# Patient Record
Sex: Male | Born: 1960 | Race: White | Hispanic: No | Marital: Married | Smoking: Former smoker
Health system: Southern US, Community
[De-identification: ages and names within clinical notes are randomized; demographics above are authoritative.]

## PROBLEM LIST (undated history)

## (undated) DIAGNOSIS — I219 Acute myocardial infarction, unspecified: Secondary | ICD-10-CM

## (undated) DIAGNOSIS — I2510 Atherosclerotic heart disease of native coronary artery without angina pectoris: Secondary | ICD-10-CM

## (undated) DIAGNOSIS — F419 Anxiety disorder, unspecified: Secondary | ICD-10-CM

## (undated) DIAGNOSIS — K219 Gastro-esophageal reflux disease without esophagitis: Secondary | ICD-10-CM

## (undated) HISTORY — DX: Gastro-esophageal reflux disease without esophagitis: K21.9

## (undated) HISTORY — PX: CARDIAC CATHETERIZATION: SHX172

## (undated) HISTORY — DX: Acute myocardial infarction, unspecified: I21.9

## (undated) HISTORY — DX: Anxiety disorder, unspecified: F41.9

## (undated) HISTORY — DX: Atherosclerotic heart disease of native coronary artery without angina pectoris: I25.10

---

## 1998-09-17 DIAGNOSIS — I219 Acute myocardial infarction, unspecified: Secondary | ICD-10-CM

## 1998-09-17 HISTORY — PX: CORONARY STENT PLACEMENT: SHX1402

## 1998-09-17 HISTORY — DX: Acute myocardial infarction, unspecified: I21.9

## 1999-05-19 ENCOUNTER — Inpatient Hospital Stay (HOSPITAL_COMMUNITY): Admission: EM | Admit: 1999-05-19 | Discharge: 1999-05-23 | Payer: Self-pay | Admitting: Cardiology

## 1999-12-15 ENCOUNTER — Emergency Department (HOSPITAL_COMMUNITY): Admission: EM | Admit: 1999-12-15 | Discharge: 1999-12-15 | Payer: Self-pay | Admitting: Emergency Medicine

## 2009-12-08 ENCOUNTER — Ambulatory Visit: Payer: Self-pay | Admitting: Cardiovascular Disease

## 2011-01-30 NOTE — Assessment & Plan Note (Signed)
[facility]                        [facility] OFFICE NOTE   NAME:[name], [name]                         MRN:          [id number]  DATE:12/08/2009                            DOB:          1961-07-22    CHIEF COMPLAINT:  Changing cardiovascular care.   HISTORY OF PRESENT ILLNESS:  [name] [name] is a 50 year old white male with  past medical history significant for coronary artery disease status post  stent to the right coronary artery in 2000, hyperlipidemia,  hypertension, who is presenting to change cardiovascular care.  The  patient states that since his myocardial infarction and stent in 2000,  he is getting along reasonably well from a cardiovascular standpoint.  He has had several stress test that have been within normal limits, and  he denies any chest discomfort, shortness of breath, lower extremity  edema, PND, orthopnea, or syncope.  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  However, he does not have any regimented exercise routine.   PAST MEDICAL HISTORY:  As above in HPI.   SOCIAL HISTORY:  The patient smoked heavily until his myocardial  infarction in 2000, he has since quit.  He does drink approximately 8  hard liquor beverages a day.   FAMILY HISTORY:  Negative for premature coronary disease.   ALLERGIES:  No known drug allergies.   MEDICATIONS:  1. Aspirin 81 mg daily.  2. Crestor 40 mg daily.  3. Lisinopril 20 mg daily.  4. Fenofibrate 134 mg daily.  5. Omeprazole 40 mg b.i.d.   REVIEW OF SYSTEMS:  As in HPI, otherwise negative.   PHYSICAL EXAMINATION:  VITAL SIGNS:  Blood pressure in the right arm  149/97, left arm 152/101, pulse is 70, saturating 98% on room air,  weighs 215 pounds.  GENERAL:  No acute distress.  HEENT:  Normocephalic, atraumatic.  NECK:  Supple.  There is no JVD.  There are no carotid bruits.  HEART:  Regular rate and rhythm without  murmur, rub, or gallop.  LUNGS:  Clear bilaterally.  ABDOMEN:  Soft, nontender, nondistended.  EXTREMITIES:  Without edema.  SKIN:  Warm and dry.  The patient signs of rosacea on his face.  MUSCULOSKELETAL:  5/5 bilateral upper lower extremity strength.  NEURO:  Nonfocal.   EKG taken today in clinic demonstrates normal sinus rhythm without any  significant ST or T-wave abnormalities.  Review of the patient's stress  test in 2005 shows inferior scar but no ischemia.  His ejection fraction  was thought to be 45% at that time.  This is labs from January 2010,  total cholesterol 140, HDL 23, LDL 83, triglycerides 170.  LFTs within  normal limits.   ASSESSMENT:  A 50 year old white male with known candidate of coronary  artery disease, who is not having any symptoms consistent with angina,  heart failure, or arrhythmia.  His alcohol intake is concerning.  His  last ejection fraction in 2005 on a stress test was also 45%; however,  he is not having any clear symptoms consistent with heart  failure.  The  patient's blood pressure is not well controlled, but he ran out of his  lisinopril 1 week ago, and his wife states that at home his blood  pressure is always within normal limits when he takes his medication.   PLAN:  We will refill the patient's prescriptions and urge him to be  compliant with his medications.  We will check a CMP, CBC, fasting  lipids, and vitamin D.  He is counseled at length regarding alcohol  intake and the dangers of being on Crestor, fenofibrate, and drinking a  significant amount of alcohol.  He was also counseled on the possible  effects of cardiomyopathy from the alcohol.  In addition to labs, we  will check a transthoracic echocardiogram in order to evaluate the  patient's left ventricular systolic function as his last imaging study  appeared to show a decrease in ejection fraction.     Brayton El, MD  Electronically Signed    SGA/MedQ  DD: 12/08/2009   DT: 12/09/2009  Job #: [id number]

## 2011-03-13 ENCOUNTER — Encounter: Payer: Self-pay | Admitting: Cardiovascular Disease

## 2011-05-15 ENCOUNTER — Encounter: Payer: Self-pay | Admitting: Cardiovascular Disease

## 2011-06-06 ENCOUNTER — Encounter: Payer: Self-pay | Admitting: Cardiovascular Disease

## 2011-12-12 ENCOUNTER — Other Ambulatory Visit: Payer: Self-pay | Admitting: Cardiology

## 2011-12-12 MED ORDER — LISINOPRIL 20 MG PO TABS: 20.0000 mg | ORAL_TABLET | Freq: Every day | ORAL | Status: DC

## 2012-01-09 ENCOUNTER — Encounter: Payer: Self-pay | Admitting: Cardiology

## 2012-01-21 ENCOUNTER — Encounter: Payer: Self-pay | Admitting: Cardiology

## 2013-02-06 ENCOUNTER — Other Ambulatory Visit: Payer: Self-pay | Admitting: Cardiology

## 2014-11-29 ENCOUNTER — Encounter: Payer: Self-pay | Admitting: *Deleted

## 2014-11-29 ENCOUNTER — Ambulatory Visit: Payer: Self-pay | Admitting: Cardiovascular Disease

## 2014-11-29 ENCOUNTER — Other Ambulatory Visit: Payer: Self-pay | Admitting: *Deleted

## 2014-12-20 ENCOUNTER — Encounter (INDEPENDENT_AMBULATORY_CARE_PROVIDER_SITE_OTHER): Payer: Self-pay

## 2014-12-20 ENCOUNTER — Encounter: Payer: Self-pay | Admitting: *Deleted

## 2014-12-20 ENCOUNTER — Encounter: Payer: Self-pay | Admitting: Cardiovascular Disease

## 2014-12-20 ENCOUNTER — Ambulatory Visit (INDEPENDENT_AMBULATORY_CARE_PROVIDER_SITE_OTHER): Payer: BLUE CROSS/BLUE SHIELD | Admitting: Cardiovascular Disease

## 2014-12-20 VITALS — BP 145/84 | HR 73 | Ht 68.0 in | Wt 226.0 lb

## 2014-12-20 DIAGNOSIS — E785 Hyperlipidemia, unspecified: Secondary | ICD-10-CM

## 2014-12-20 DIAGNOSIS — R079 Chest pain, unspecified: Secondary | ICD-10-CM | POA: Diagnosis not present

## 2014-12-20 DIAGNOSIS — I25118 Atherosclerotic heart disease of native coronary artery with other forms of angina pectoris: Secondary | ICD-10-CM

## 2014-12-20 DIAGNOSIS — I10 Essential (primary) hypertension: Secondary | ICD-10-CM

## 2014-12-20 DIAGNOSIS — R12 Heartburn: Secondary | ICD-10-CM | POA: Diagnosis not present

## 2014-12-20 DIAGNOSIS — E1169 Type 2 diabetes mellitus with other specified complication: Secondary | ICD-10-CM | POA: Insufficient documentation

## 2014-12-20 NOTE — Patient Instructions (Addendum)
Your physician wants you to follow-up in: 1 year with Dr. Kirke CorinArida. You will receive a reminder letter in the mail two months in advance. If you don't receive a letter, please call our office to schedule the follow-up appointment.   [facility] MYOVIEW  Your caregiver has ordered a Stress Test with nuclear imaging. The purpose of this test is to evaluate the blood supply to your heart muscle. This procedure is referred to as a "Non-Invasive Stress Test." This is because other than having an IV started in your vein, nothing is inserted or "invades" your body. Cardiac stress tests are done to find areas of poor blood flow to the heart by determining the extent of coronary artery disease (CAD). Some patients exercise on a treadmill, which naturally increases the blood flow to your heart, while others who are  unable to walk on a treadmill due to physical limitations have a pharmacologic/chemical stress agent called Lexiscan . This medicine will mimic walking on a treadmill by temporarily increasing your coronary blood flow.   Please note: these test may take anywhere between 2-4 hours to complete  [facility]  THE VOLUNTEERS AT THE FIRST DESK WILL DIRECT YOU WHERE TO GO  Date of Procedure:___________Wednesday 4/13/16__________________________  Arrival Time for Procedure:___________9:45 am___________________  Instructions regarding medication:   ____ : Hold diabetes medication morning of procedure  __x__:  Hold betablocker(s) night before procedure and morning of procedure-  (atenolol)  ____:  Hold other medications as follows:_________________________________________________________________________________________________________________________________________________________________________________________________________________________________________________________________________________________  PLEASE NOTIFY THE OFFICE AT LEAST 24 HOURS IN ADVANCE IF YOU ARE UNABLE  TO KEEP YOUR APPOINTMENT.  [phone] AND  PLEASE NOTIFY NUC[facility]AR MEDICINE AT [facility] AT LEAST 24 HOURS IN ADVANCE IF YOU ARE UNABLE TO KEEP YOUR APPOINTMENT. [phone]  How to prepare for your Myoview test:  1. Do not eat or drink after midnight 2. No caffeine for 24 hours prior to test 3. No smoking 24 hours prior to test. 4. Your medication may be taken with water.  If your doctor stopped a medication because of this test, do not take that medication. 5. Ladies, please do not wear dresses.  Skirts or pants are appropriate. Please wear a short sleeve shirt. 6. No perfume, cologne or lotion. 7. Wear comfortable walking shoes. No heels!

## 2014-12-20 NOTE — Assessment & Plan Note (Signed)
The patient has known history of coronary artery disease with previous myocardial infarction and stenting to the right coronary artery in 2000. Current symptoms include worsening heartburn which is likely due to GERD. However, he has chronic exertional dyspnea and no recent cardiac evaluation. Thus, I requested a treadmill nuclear stress test for evaluation. His baseline ECG is abnormal with evidence of prior inferior infarct. Continue aggressive treatment of risk factors.

## 2014-12-20 NOTE — Assessment & Plan Note (Signed)
He reports significant myalgia and intolerance to atorvastatin. He is tolerating rosuvastatin but it is not covered by his insurance. I asked him to discuss with Dr. Sherie DonLada as I don't have the results of his lipid profile. Basically I recommend a target LDL of less than 70. If episodes tend statin is needed, then a prior authorization for rosuvastatin might be needed. If his LDL was not significantly elevated, a trial of pravastatin can be considered.

## 2014-12-20 NOTE — Assessment & Plan Note (Signed)
Blood pressure improved gradually after resuming antihypertensive medications.

## 2014-12-20 NOTE — Progress Notes (Signed)
Primary care physician: Dr. Sherie Don  HPI  [name] is a 54 year old white male with past medical history significant for coronary artery disease status post myocardial infarction and stent placement to the right coronary artery in 2000, hyperlipidemia, and hypertension.  He was seen by our group in the [place] office with most recent visit in 2011. He had no cardiac events since his stent placement. She has other chronic medical conditions that include hypertension, hyperlipidemia and excessive alcohol use. He quit smoking after his myocardial infarction in 2000. He reports worsening heartburn recently with known history of GERD. He swallowed a crown which might have aggravated his symptoms especially that he tried to induce vomiting to retrieve it. He has stable exertional dyspnea with no chest tightness. He reports binge drinking of alcohol on occasions. He has no family history of coronary artery disease. 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 Blood pressure was running high recently but improved after resuming antihypertensive medications.  No Known Allergies   Current Outpatient Prescriptions on File Prior to Visit  Medication Sig Dispense Refill  . aspirin 81 MG tablet Take 81 mg by mouth daily.       No current facility-administered medications on file prior to visit.     Past Medical History  Diagnosis Date  . CAD (coronary artery disease)     s/p stenting  . HLD (hyperlipidemia)   . HTN (hypertension)   . GERD (gastroesophageal reflux disease)   . MI (myocardial infarction)      Past Surgical History  Procedure Laterality Date  . Coronary stent placement  9/00  . Cardiac catheterization      [facility]     Family History  Problem Relation Age of Onset  . Colitis    . Heart disease    . Hypertension Mother   . Hyperlipidemia Mother   . Hypertension Father   . Hyperlipidemia Father      History   Social History  . Marital Status: Married    Spouse Name: N/A    . Number of Children: 0  . Years of Education: N/A   Occupational History  . [employer]ICE [employer]    Social History Main Topics  . Smoking status: Former Smoker    Quit date: 05/19/1999  . Smokeless tobacco: Not on file  . Alcohol Use: 25.0 oz/week    50 Standard drinks or equivalent per week  . Drug Use: No     Comment: last in 1980's  . Sexual Activity: Not on file   Other Topics Concern  . Not on file   Social History Narrative     ROS A 10 point review of system was performed. It is negative other than that mentioned in the history of present illness.   PHYSICAL EXAM   BP 145/84 mmHg  Pulse 73  Ht  (1.727 m)  Wt 226 lb (102.513 kg)  BMI 34.37 kg/m2 Constitutional: He is oriented to person, place, and time. He appears well-developed and well-nourished. No distress.  HENT: No nasal discharge.  Head: Normocephalic and atraumatic.  Eyes: Pupils are equal and round.  No discharge. Neck: Normal range of motion. Neck supple. No JVD present. No thyromegaly present.  Cardiovascular: Normal rate, regular rhythm, normal heart sounds. Exam reveals no gallop and no friction rub. No murmur heard.  Pulmonary/Chest: Effort normal and breath sounds normal. No stridor. No respiratory distress. He has no wheezes. He has no rales. He exhibits no tenderness.  Abdominal: Soft. Bowel sounds  are normal. He exhibits no distension. There is no tenderness. There is no rebound and no guarding.  Musculoskeletal: Normal range of motion. He exhibits no edema and no tenderness.  Neurological: He is alert and oriented to person, place, and time. Coordination normal.  Skin: Skin is warm and dry. No rash noted. He is not diaphoretic. No erythema. No pallor.  Psychiatric: He has a normal mood and affect. His behavior is normal. Judgment and thought content normal.       VZD:GLOVFEKG:Sinus  Rhythm  -Inferior infarct -age undetermined.   ABNORMAL    ASSESSMENT AND PLAN

## 2014-12-27 ENCOUNTER — Telehealth: Payer: Self-pay | Admitting: *Deleted

## 2014-12-27 NOTE — Telephone Encounter (Signed)
Patient needs to r/s his Myoview.

## 2014-12-27 NOTE — Telephone Encounter (Signed)
Spoke w/ pt's wife.  Advised her that I was calling to resched pt's myoview. Inquired as to what day they would like to move it to.  She states that she is not near a calendar and for me to pick a day next month.  Asked her to call back w/ a date that works for them.  She is appreciative and will call back.

## 2015-02-22 ENCOUNTER — Telehealth: Payer: Self-pay | Admitting: Family Medicine

## 2015-02-22 MED ORDER — ATENOLOL 25 MG PO TABS: 12.5000 mg | ORAL_TABLET | Freq: Every day | ORAL | Status: DC

## 2015-02-22 MED ORDER — RANITIDINE HCL 300 MG PO TABS: 300.0000 mg | ORAL_TABLET | Freq: Every day | ORAL | Status: DC

## 2015-02-22 NOTE — Telephone Encounter (Signed)
Patients wife called stating that her husband needs refill on his Lisinopril and Atenolol sent to [facility]. Fax # [phone],thanks.

## 2015-02-22 NOTE — Telephone Encounter (Signed)
Spoke with patient wife and explain how he should be taking his medicine and let her know he needs and appt to be seen with fasting labs. She got upset and hung the phone.

## 2015-02-22 NOTE — Telephone Encounter (Signed)
Our list had him taking 12.5 mg of atenolol daily; if he taking it differently, please have him contact Dr. Kirke CorinARIDA since Dr. Kirke CorinArida is his heart doctor, he can adjust and prescribe the beta-blocker if it has been changed since I started it Patient did not return to see me in mid to late April, needs appt Please ask him to schedule appt and we'll get fasting labs then

## 2015-03-01 ENCOUNTER — Other Ambulatory Visit: Payer: Self-pay | Admitting: Family Medicine

## 2015-03-01 NOTE — Telephone Encounter (Signed)
Pt's wife called stated pt is out of Lisinopril and needs a refill ASAP. Pt is scheduled for an appt on 03/09/15 @ 1pm. Would like to know if enough medication can be called in to last until the date of his appt.

## 2015-03-01 NOTE — Telephone Encounter (Signed)
I really need the exact medicine, dose, instructions, etc. loaded into the med list along with allergies please Contact pharmacy if you don't have the details or there is any question This has to be exact as we transition from [facility] to [facility] Thank you

## 2015-03-02 MED ORDER — LISINOPRIL-HYDROCHLOROTHIAZIDE 20-12.5 MG PO TABS: 1.0000 | ORAL_TABLET | Freq: Every day | ORAL | Status: DC

## 2015-03-02 NOTE — Telephone Encounter (Signed)
Medication and dosage added. Routing to provider.

## 2015-03-04 DIAGNOSIS — K219 Gastro-esophageal reflux disease without esophagitis: Secondary | ICD-10-CM | POA: Insufficient documentation

## 2015-03-04 DIAGNOSIS — I10 Essential (primary) hypertension: Secondary | ICD-10-CM | POA: Insufficient documentation

## 2015-03-04 DIAGNOSIS — I252 Old myocardial infarction: Secondary | ICD-10-CM | POA: Insufficient documentation

## 2015-03-04 DIAGNOSIS — I2510 Atherosclerotic heart disease of native coronary artery without angina pectoris: Secondary | ICD-10-CM | POA: Insufficient documentation

## 2015-03-09 ENCOUNTER — Ambulatory Visit: Payer: Self-pay | Admitting: Family Medicine

## 2016-06-08 ENCOUNTER — Telehealth: Payer: Self-pay | Admitting: Cardiovascular Disease

## 2016-06-08 NOTE — Telephone Encounter (Signed)
4 attempts to contact via phone to schedule fu from recall.  Deleting from recall.

## 2017-08-31 ENCOUNTER — Other Ambulatory Visit: Payer: Self-pay

## 2017-08-31 ENCOUNTER — Encounter: Payer: Self-pay | Admitting: Emergency Medicine

## 2017-08-31 ENCOUNTER — Emergency Department: Payer: BLUE CROSS/BLUE SHIELD

## 2017-08-31 ENCOUNTER — Inpatient Hospital Stay
Admission: EM | Admit: 2017-08-31 | Discharge: 2017-09-02 | DRG: 065 | Disposition: A | Discharge status: Home / Self Care | Payer: BLUE CROSS/BLUE SHIELD | Attending: Specialist | Admitting: Specialist

## 2017-08-31 ENCOUNTER — Inpatient Hospital Stay: Payer: BLUE CROSS/BLUE SHIELD

## 2017-08-31 DIAGNOSIS — I639 Cerebral infarction, unspecified: Principal | ICD-10-CM | POA: Diagnosis present

## 2017-08-31 DIAGNOSIS — R29703 NIHSS score 3: Secondary | ICD-10-CM | POA: Diagnosis present

## 2017-08-31 DIAGNOSIS — Z87891 Personal history of nicotine dependence: Secondary | ICD-10-CM

## 2017-08-31 DIAGNOSIS — Z23 Encounter for immunization: Secondary | ICD-10-CM | POA: Diagnosis not present

## 2017-08-31 DIAGNOSIS — I6930 Unspecified sequelae of cerebral infarction: Secondary | ICD-10-CM | POA: Diagnosis present

## 2017-08-31 DIAGNOSIS — I10 Essential (primary) hypertension: Secondary | ICD-10-CM | POA: Diagnosis present

## 2017-08-31 DIAGNOSIS — Z79899 Other long term (current) drug therapy: Secondary | ICD-10-CM

## 2017-08-31 DIAGNOSIS — F419 Anxiety disorder, unspecified: Secondary | ICD-10-CM | POA: Diagnosis present

## 2017-08-31 DIAGNOSIS — E785 Hyperlipidemia, unspecified: Secondary | ICD-10-CM | POA: Diagnosis present

## 2017-08-31 DIAGNOSIS — I2510 Atherosclerotic heart disease of native coronary artery without angina pectoris: Secondary | ICD-10-CM | POA: Diagnosis present

## 2017-08-31 DIAGNOSIS — I252 Old myocardial infarction: Secondary | ICD-10-CM | POA: Diagnosis not present

## 2017-08-31 DIAGNOSIS — G8192 Hemiplegia, unspecified affecting left dominant side: Secondary | ICD-10-CM | POA: Diagnosis present

## 2017-08-31 DIAGNOSIS — R29810 Facial weakness: Secondary | ICD-10-CM | POA: Diagnosis present

## 2017-08-31 DIAGNOSIS — I739 Peripheral vascular disease, unspecified: Secondary | ICD-10-CM | POA: Diagnosis present

## 2017-08-31 DIAGNOSIS — Z955 Presence of coronary angioplasty implant and graft: Secondary | ICD-10-CM | POA: Diagnosis not present

## 2017-08-31 DIAGNOSIS — Z9114 Patient's other noncompliance with medication regimen: Secondary | ICD-10-CM | POA: Diagnosis not present

## 2017-08-31 DIAGNOSIS — R4781 Slurred speech: Secondary | ICD-10-CM | POA: Diagnosis present

## 2017-08-31 LAB — DIFFERENTIAL
Basophils Absolute: 0.1 10*3/uL (ref 0–0.1)
Basophils Relative: 1 %
EOS PCT: 1 %
Eosinophils Absolute: 0.1 10*3/uL (ref 0–0.7)
LYMPHS ABS: 1.7 10*3/uL (ref 1.0–3.6)
LYMPHS PCT: 21 %
MONO ABS: 0.5 10*3/uL (ref 0.2–1.0)
MONOS PCT: 7 %
NEUTROS ABS: 5.7 10*3/uL (ref 1.4–6.5)
Neutrophils Relative %: 70 %

## 2017-08-31 LAB — COMPREHENSIVE METABOLIC PANEL
ALT: 34 U/L (ref 17–63)
ANION GAP: 7 (ref 5–15)
AST: 31 U/L (ref 15–41)
Albumin: 3.9 g/dL (ref 3.5–5.0)
Alkaline Phosphatase: 87 U/L (ref 38–126)
BILIRUBIN TOTAL: 0.8 mg/dL (ref 0.3–1.2)
BUN: 12 mg/dL (ref 6–20)
CO2: 25 mmol/L (ref 22–32)
CREATININE: 0.86 mg/dL (ref 0.61–1.24)
Calcium: 8.9 mg/dL (ref 8.9–10.3)
Chloride: 104 mmol/L (ref 101–111)
Glucose, Bld: 204 mg/dL — ABNORMAL HIGH (ref 65–99)
Potassium: 4.2 mmol/L (ref 3.5–5.1)
Sodium: 136 mmol/L (ref 135–145)
TOTAL PROTEIN: 6.7 g/dL (ref 6.5–8.1)

## 2017-08-31 LAB — TROPONIN I
Troponin I: <0.03 ng/mL (ref <0.03)
Troponin I: <0.03 ng/mL (ref <0.03)
Troponin I: <0.03 ng/mL (ref <0.03)

## 2017-08-31 LAB — CBC
HEMATOCRIT: 49.6 % (ref 40.0–52.0)
Hemoglobin: 16.9 g/dL (ref 13.0–18.0)
MCH: 28.8 pg (ref 26.0–34.0)
MCHC: 34.1 g/dL (ref 32.0–36.0)
MCV: 84.3 fL (ref 80.0–100.0)
PLATELETS: 252 10*3/uL (ref 150–440)
RBC: 5.88 MIL/uL (ref 4.40–5.90)
RDW: 13.6 % (ref 11.5–14.5)
WBC: 8 10*3/uL (ref 3.8–10.6)

## 2017-08-31 LAB — LIPID PANEL
CHOL/HDL RATIO: 10.3 ratio
CHOLESTEROL: 277 mg/dL — AB (ref 0–200)
HDL: 27 mg/dL — ABNORMAL LOW (ref >40)
LDL Cholesterol: UNDETERMINED mg/dL (ref 0–99)
TRIGLYCERIDES: 467 mg/dL — AB (ref <150)
VLDL: UNDETERMINED mg/dL (ref 0–40)

## 2017-08-31 LAB — PROTIME-INR
INR: 0.94
PROTHROMBIN TIME: 12.5 s (ref 11.4–15.2)

## 2017-08-31 LAB — APTT: aPTT: 27 seconds (ref 24–36)

## 2017-08-31 LAB — TSH: TSH: 1.569 u[IU]/mL (ref 0.350–4.500)

## 2017-08-31 MED ORDER — ACETAMINOPHEN 325 MG PO TABS
650.0000 mg | ORAL_TABLET | Freq: Four times a day (QID) | ORAL | Status: DC | PRN
Start: 2017-08-31 — End: 2017-09-02

## 2017-08-31 MED ORDER — DOCUSATE SODIUM 100 MG PO CAPS
100.0000 mg | ORAL_CAPSULE | Freq: Two times a day (BID) | ORAL | Status: DC
  Administered 2017-08-31 – 2017-09-02 (×4): 100 mg via ORAL
  Filled 2017-08-31 (×4): qty 1

## 2017-08-31 MED ORDER — INFLUENZA VAC SPLIT QUAD 0.5 ML IM SUSY
0.5000 mL | PREFILLED_SYRINGE | INTRAMUSCULAR | Status: AC
  Administered 2017-09-01: 0.5 mL via INTRAMUSCULAR
  Filled 2017-08-31: qty 0.5

## 2017-08-31 MED ORDER — HYDRALAZINE HCL 20 MG/ML IJ SOLN
10.0000 mg | Freq: Four times a day (QID) | INTRAMUSCULAR | Status: DC | PRN
  Administered 2017-08-31: 10 mg via INTRAVENOUS
  Filled 2017-08-31: qty 1

## 2017-08-31 MED ORDER — POLYETHYLENE GLYCOL 3350 17 G PO PACK: 17.0000 g | PACK | Freq: Every day | ORAL | Status: DC | PRN

## 2017-08-31 MED ORDER — ASPIRIN EC 81 MG PO TBEC
81.0000 mg | DELAYED_RELEASE_TABLET | Freq: Every day | ORAL | Status: DC
  Administered 2017-08-31 – 2017-09-02 (×3): 81 mg via ORAL
  Filled 2017-08-31 (×4): qty 1

## 2017-08-31 MED ORDER — ALPRAZOLAM 0.25 MG PO TABS
0.2500 mg | ORAL_TABLET | Freq: Every evening | ORAL | Status: DC | PRN
  Administered 2017-08-31: 21:00:00 0.25 mg via ORAL
  Filled 2017-08-31: qty 1

## 2017-08-31 MED ORDER — LISINOPRIL 20 MG PO TABS
20.0000 mg | ORAL_TABLET | Freq: Every day | ORAL | Status: DC
  Administered 2017-08-31 – 2017-09-02 (×3): 20 mg via ORAL
  Filled 2017-08-31 (×3): qty 1

## 2017-08-31 MED ORDER — LABETALOL HCL 5 MG/ML IV SOLN
10.0000 mg | Freq: Once | INTRAVENOUS | Status: AC
  Administered 2017-08-31: 10 mg via INTRAVENOUS
  Filled 2017-08-31: qty 4

## 2017-08-31 MED ORDER — ROSUVASTATIN CALCIUM 10 MG PO TABS: 10.0000 mg | ORAL_TABLET | Freq: Every day | ORAL | Status: DC

## 2017-08-31 MED ORDER — ATORVASTATIN CALCIUM 20 MG PO TABS
40.0000 mg | ORAL_TABLET | Freq: Every day | ORAL | Status: DC
  Administered 2017-08-31 – 2017-09-01 (×2): 40 mg via ORAL
  Filled 2017-08-31 (×3): qty 2

## 2017-08-31 MED ORDER — ENOXAPARIN SODIUM 40 MG/0.4ML ~~LOC~~ SOLN
40.0000 mg | SUBCUTANEOUS | Status: DC
  Administered 2017-08-31 – 2017-09-01 (×2): 40 mg via SUBCUTANEOUS
  Filled 2017-08-31 (×2): qty 0.4

## 2017-08-31 MED ORDER — FLUTICASONE PROPIONATE 50 MCG/ACT NA SUSP
1.0000 | Freq: Every day | NASAL | Status: DC
  Administered 2017-08-31 – 2017-09-02 (×3): 1 via NASAL
  Filled 2017-08-31: qty 16

## 2017-08-31 MED ORDER — ONDANSETRON HCL 4 MG/2ML IJ SOLN: 4.0000 mg | Freq: Four times a day (QID) | INTRAMUSCULAR | Status: DC | PRN

## 2017-08-31 MED ORDER — LABETALOL HCL 5 MG/ML IV SOLN
INTRAVENOUS | Status: AC
  Administered 2017-08-31: 10 mg via INTRAVENOUS
  Filled 2017-08-31: qty 4

## 2017-08-31 MED ORDER — ONDANSETRON HCL 4 MG PO TABS: 4.0000 mg | ORAL_TABLET | Freq: Four times a day (QID) | ORAL | Status: DC | PRN

## 2017-08-31 MED ORDER — LABETALOL HCL 5 MG/ML IV SOLN
10.0000 mg | Freq: Once | INTRAVENOUS | Status: AC
  Administered 2017-08-31: 10 mg via INTRAVENOUS

## 2017-08-31 MED ORDER — ACETAMINOPHEN 650 MG RE SUPP: 650.0000 mg | Freq: Four times a day (QID) | RECTAL | Status: DC | PRN

## 2017-08-31 NOTE — ED Notes (Signed)
Pt taken to CT by Georgiann HahnKat, CT tech

## 2017-08-31 NOTE — Plan of Care (Signed)
  Progressing Education: Knowledge of General Education information will improve 08/31/2017 1943 - Progressing by Donnel SaxonKennedy, Bertie Mcconathy L, RN Education: Knowledge of disease or condition will improve 08/31/2017 1943 - Progressing by Donnel SaxonKennedy, Tyrone Balash L, RN Knowledge of secondary prevention will improve 08/31/2017 1943 - Progressing by Donnel SaxonKennedy, Haylei Cobin L, RN Knowledge of patient specific risk factors addressed and post discharge goals established will improve 08/31/2017 1943 - Progressing by Donnel SaxonKennedy, Niyam Bisping L, RN Coping: Will identify appropriate support needs 08/31/2017 1943 - Progressing by Donnel SaxonKennedy, Yang Rack L, RN Health Behavior/Discharge Planning: Ability to manage health-related needs will improve 08/31/2017 1943 - Progressing by Donnel SaxonKennedy, Jeree Delcid L, RN Self-Care: Ability to participate in self-care as condition permits will improve 08/31/2017 1943 - Progressing by Donnel SaxonKennedy, Rasheena Talmadge L, RN Ability to communicate needs accurately will improve 08/31/2017 1943 - Progressing by Donnel SaxonKennedy, Shalita Notte L, RN Nutrition: Risk of aspiration will decrease 08/31/2017 1943 - Progressing by Donnel SaxonKennedy, Vinette Crites L, RN Ischemic Stroke/TIA Tissue Perfusion: Complications of ischemic stroke/TIA will be minimized 08/31/2017 1943 - Progressing by Donnel SaxonKennedy, Santana Edell L, RN

## 2017-08-31 NOTE — ED Notes (Signed)
Attempted to call report on pt, RN unable to take report at this time. Ascom number left for nurse to call me back

## 2017-08-31 NOTE — H&P (Signed)
[facility] [place] [facility]   PATIENT NAME: [name] [name]    MR#:  [id number]  DATE OF BIRTH:  1961/06/26  DATE OF ADMISSION:  08/31/2017  PRIMARY CARE PHYSICIAN: Patient, No Pcp Per   REQUESTING/REFERRING PHYSICIAN: Dr. Dionne Bucy  CHIEF COMPLAINT:   Chief Complaint  Patient presents with  . Weakness  . Chest Pain    HISTORY OF PRESENT ILLNESS:  [name] [name]  is a 56 y.o. male with a known history of CAD status post stent about 18 years ago, hypertension not taking any medications currently presents to hospital secondary to weakness, left facial droop since yesterday. Patient was a smoker, stopped smoking about 18 years ago when he had a heart attack. Currently is not taking any aspirin or any other medications at home. For 2 months he hasn't been taking his blood pressure medications as well. He was doing fine, independent and active at baseline. Since yesterday he is noted to have significant weakness especially on his left side. He noticed that his speech was slowed and started to have more facial droop later in the day. Initially they thought was flulike symptoms with body aches, however with pronounced facial droop he was brought to the emergency room today. He has passed the window period for TPA. CT of the head did not show any acute findings. Still has the facial droop though only minimal drift of left upper extremity noted. He is being admitted for possible acute CVA  PAST MEDICAL HISTORY:   Past Medical History:  Diagnosis Date  . CAD (coronary artery disease)    s/p stenting  . GERD (gastroesophageal reflux disease)   . HLD (hyperlipidemia)   . HTN (hypertension)   . MI (myocardial infarction) (HCC) 2000    PAST SURGICAL HISTORY:   Past Surgical History:  Procedure Laterality Date  . CARDIAC CATHETERIZATION     [facility]  . CORONARY STENT PLACEMENT  9/00    SOCIAL HISTORY:   Social History   Tobacco Use  . Smoking status:  Former Smoker    Last attempt to quit: 05/19/1999    Years since quitting: 18.2  Substance Use Topics  . Alcohol use: Yes    Alcohol/week: 25.0 oz    Types: 50 Standard drinks or equivalent per week    Comment: hard liquor 1-2 times/week    FAMILY HISTORY:   Family History  Problem Relation Age of Onset  . Hypertension Mother   . Hyperlipidemia Mother   . Arthritis Mother   . Diabetes Mother   . Hypertension Father   . Hyperlipidemia Father   . Alcohol abuse Father   . Diabetes Father   . Colitis Unknown   . Heart disease Unknown     DRUG ALLERGIES:  No Known Allergies  REVIEW OF SYSTEMS:   Review of Systems  Constitutional: Positive for malaise/fatigue. Negative for chills, fever and weight loss.  HENT: Negative for ear discharge, ear pain, hearing loss and nosebleeds.   Eyes: Negative for blurred vision, double vision and photophobia.  Respiratory: Negative for cough, hemoptysis, shortness of breath and wheezing.   Cardiovascular: Negative for chest pain, palpitations, orthopnea and leg swelling.  Gastrointestinal: Negative for abdominal pain, constipation, diarrhea, heartburn, melena, nausea and vomiting.  Genitourinary: Negative for dysuria, frequency, hematuria and urgency.  Musculoskeletal: Negative for back pain, myalgias and neck pain.  Skin: Negative for rash.  Neurological: Positive for speech change and focal weakness. Negative for dizziness, tingling, tremors, sensory change and  headaches.  Endo/Heme/Allergies: Does not bruise/bleed easily.  Psychiatric/Behavioral: Negative for depression.    MEDICATIONS AT HOME:   Prior to Admission medications   Medication Sig Start Date End Date Taking? Authorizing Provider  lisinopril (PRINIVIL,ZESTRIL) 10 MG tablet Take 10 mg by mouth daily. 08/30/17  Yes [provider]  atenolol (TENORMIN) 25 MG tablet Take 0.5 tablets (12.5 mg total) by mouth daily. Patient not taking: Reported on 08/31/2017 02/22/15    Kerman PasseyLada, Melinda P, MD  lisinopril-hydrochlorothiazide (PRINZIDE,ZESTORETIC) 20-12.5 MG per tablet Take 1 tablet by mouth daily. Patient not taking: Reported on 08/31/2017 03/02/15   Kerman PasseyLada, Melinda P, MD  ranitidine (ZANTAC) 300 MG tablet Take 1 tablet (300 mg total) by mouth daily. Patient not taking: Reported on 08/31/2017 02/22/15   Kerman PasseyLada, Melinda P, MD      VITAL SIGNS:  Blood pressure (!) 187/106, pulse 65, temperature 98.4 F (36.9 C), temperature source Oral, resp. rate 11, height 5\' 10"  (1.778 m), weight 93 kg (205 lb), SpO2 96 %.  PHYSICAL EXAMINATION:   Physical Exam  GENERAL:  56 y.o.-year-old patient lying in the bed with no acute distress.  EYES: Pupils equal, round, reactive to light and accommodation. No scleral icterus. Extraocular muscles intact.  HEENT: Head atraumatic, normocephalic. Left facial droop noted. Oropharynx and nasopharynx clear.  NECK:  Supple, no jugular venous distention. No thyroid enlargement, no tenderness.  LUNGS: Normal breath sounds bilaterally, no wheezing, rales,rhonchi or crepitation. No use of accessory muscles of respiration.  CARDIOVASCULAR: S1, S2 normal. No murmurs, rubs, or gallops.  ABDOMEN: Soft, nontender, nondistended. Bowel sounds present. No organomegaly or mass.  EXTREMITIES: No pedal edema, cyanosis, or clubbing.  NEUROLOGIC: Cranial nerves II through XII are intact. Left facial droop noted. Slight slurred speech but no expressive face COPD. Muscle strength 5/5 in all extremities except minimal drift of left upper extremity. Sensation intact. Gait not checked.  PSYCHIATRIC: The patient is alert and oriented x 3.  SKIN: No obvious rash, lesion, or ulcer.   LABORATORY PANEL:   CBC Recent Labs  Lab 08/31/17 1123  WBC 8.0  HGB 16.9  HCT 49.6  PLT 252   ------------------------------------------------------------------------------------------------------------------  Chemistries  Recent Labs  Lab 08/31/17 1123  NA 136  K 4.2    CL 104  CO2 25  GLUCOSE 204*  BUN 12  CREATININE 0.86  CALCIUM 8.9  AST 31  ALT 34  ALKPHOS 87  BILITOT 0.8   ------------------------------------------------------------------------------------------------------------------  Cardiac Enzymes Recent Labs  Lab 08/31/17 1123  TROPONINI <0.03   ------------------------------------------------------------------------------------------------------------------  RADIOLOGY:  Ct Head Wo Contrast  Result Date: 08/31/2017 CLINICAL DATA:  Left facial droop and grip weakness. EXAM: CT HEAD WITHOUT CONTRAST TECHNIQUE: Contiguous axial images were obtained from the base of the skull through the vertex without intravenous contrast. COMPARISON:  None FINDINGS: Brain: No evidence of acute infarction, hemorrhage, hydrocephalus, extra-axial collection or mass lesion/mass effect. Vascular: No hyperdense vessel or unexpected calcification. Skull: Normal. Negative for fracture or focal lesion. Sinuses/Orbits: No acute finding. Other: None. IMPRESSION: 1. No acute intracranial abnormalities. Normal appearance of the brain. Electronically Signed   By: Signa Kellaylor  Stroud M.D.   On: 08/31/2017 12:14    EKG:   Orders placed or performed during the hospital encounter of 08/31/17  . EKG 12-Lead  . EKG 12-Lead  . ED EKG  . ED EKG    IMPRESSION AND PLAN:   Massie KluverRicky Driggs  is a 56 y.o. male with a known history of CAD status post stent about  18 years ago, hypertension not taking any medications currently presents to hospital secondary to weakness, left facial droop since yesterday.  #1 acute CVA-with left facial droop, slurred speech and left upper extremity drift. -CT head negative. Admit for MRI of the brain, MRA, echocardiogram. -Continue neuro checks. Start on aspirin and statin. -Check TSH, A1c and lipid panel. -Neurology consulted. -PT consult/OT consult and speech consult  2. CAD s/p stents- stable, several years ago - On aspirin and statin  3.  Hypertension-started on lisinopril. Also IV hydralazine when necessary. Adjust medications as needed  4. DVT prophylaxis-Lovenox    All the records are reviewed and case discussed with ED provider. Management plans discussed with the patient, family and they are in agreement.  CODE STATUS: Full Code  TOTAL TIME TAKING CARE OF THIS PATIENT: 50 minutes.    Enid BaasKALISETTI,Yatzary Merriweather M.D on 08/31/2017 at 1:45 PM  Between 7am to 6pm - Pager - [phone]  After 6pm go to [email or web address] - password [email or web address]  [facility]  Office  [phone]  CC: Primary care physician; Patient, No Pcp Per

## 2017-08-31 NOTE — ED Notes (Signed)
Patient transported to MRI 

## 2017-08-31 NOTE — ED Provider Notes (Signed)
[facility] Emergency Department Provider Note ____________________________________________   First MD Initiated Contact with Patient 08/31/17 1127     (approximate)  I have reviewed the triage vital signs and the nursing notes.   HISTORY  Chief Complaint Weakness and Chest Pain    HPI [name] [name] is a 56 y.o. male with past medical history as noted below who presents with weakness to the left side especially to the left face, relatively acute onset yesterday at approximately midday, and associated with intermittent relatively mild lower substernal chest pain.  Patient states that he is also been having some word finding difficulties and difficulties with conversation.  No prior history of symptoms like this.  Patient states that he has not been ill recently and was in his usual state of health until yesterday, however he has been noncompliant with his blood pressure medication for several months prior to the last 2 days when he was able to get a refill.  Patient took aspirin today.   Past Medical History:  Diagnosis Date  . CAD (coronary artery disease)    s/p stenting  . GERD (gastroesophageal reflux disease)   . HLD (hyperlipidemia)   . HTN (hypertension)   . MI (myocardial infarction) [facility](HCC) 2000    Patient Active Problem List   Diagnosis Date Noted  . HLD (hyperlipidemia)   . HTN (hypertension)   . CAD (coronary artery disease)   . GERD (gastroesophageal reflux disease)   . MI (myocardial infarction) (HCC)   . Coronary artery disease 12/20/2014  . Hyperlipidemia 12/20/2014  . Essential hypertension 12/20/2014    Past Surgical History:  Procedure Laterality Date  . CARDIAC CATHETERIZATION     [facility]  . CORONARY STENT PLACEMENT  9/00    Prior to Admission medications   Medication Sig Start Date End Date Taking? Authorizing Provider  ALPRAZolam Prudy Feeler(XANAX) 0.25 MG tablet Take 0.25 mg by mouth at bedtime as needed for anxiety.    [provider]  aspirin 81 MG tablet Take 81 mg by mouth daily.      [provider]  atenolol (TENORMIN) 25 MG tablet Take 0.5 tablets (12.5 mg total) by mouth daily. 02/22/15   Kerman PasseyLada, Melinda P, MD  fluticasone (FLONASE) 50 MCG/ACT nasal spray Place 1 spray into both nostrils daily.    [provider]  lisinopril-hydrochlorothiazide (PRINZIDE,ZESTORETIC) 20-12.5 MG per tablet Take 1 tablet by mouth daily.    [provider]  lisinopril-hydrochlorothiazide (PRINZIDE,ZESTORETIC) 20-12.5 MG per tablet Take 1 tablet by mouth daily. 03/02/15   Kerman PasseyLada, Melinda P, MD  ranitidine (ZANTAC) 300 MG tablet Take 1 tablet (300 mg total) by mouth daily. 02/22/15   Kerman PasseyLada, Melinda P, MD  rosuvastatin (CRESTOR) 10 MG tablet Take 10 mg by mouth daily.    [provider]    Allergies Patient has no known allergies.  Family History  Problem Relation Age of Onset  . Hypertension Mother   . Hyperlipidemia Mother   . Arthritis Mother   . Diabetes Mother   . Hypertension Father   . Hyperlipidemia Father   . Alcohol abuse Father   . Diabetes Father   . Colitis Unknown   . Heart disease Unknown     Social History Social History   Tobacco Use  . Smoking status: Former Smoker    Last attempt to quit: 05/19/1999    Years since quitting: 18.2  Substance Use Topics  . Alcohol use: Yes    Alcohol/week: 25.0 oz  Types: 50 Standard drinks or equivalent per week  . Drug use: No    Comment: last in 1980's    Review of Systems  Constitutional: No fever/chills.  Eyes: No visual changes. ENT: No sore throat. Cardiovascular: Denies chest pain. Respiratory: Denies shortness of breath. Gastrointestinal: No nausea, no vomiting.  No diarrhea.  Genitourinary: Negative for flank pain.  Musculoskeletal: Negative for back pain. Skin: Negative for rash. Neurological: Negative for headaches, positive for left facial droop.   ____________________________________________   PHYSICAL  EXAM:  VITAL SIGNS: ED Triage Vitals  Enc Vitals Group     BP 08/31/17 1119 (!) 204/104     Pulse Rate 08/31/17 1119 74     Resp 08/31/17 1119 20     Temp 08/31/17 1119 98.4 F (36.9 C)     Temp Source 08/31/17 1119 Oral     SpO2 08/31/17 1119 99 %     Weight 08/31/17 1121 205 lb (93 kg)     Height 08/31/17 1121 5\' 10"  (1.778 m)     Head Circumference --      Peak Flow --      Pain Score 08/31/17 1119 2     Pain Loc --      Pain Edu? --      Excl. in GC? --     Constitutional: Alert and oriented.  Relatively well appearing and in no acute distress. Eyes: Conjunctivae are normal.  EOMI.  PERRLA. Head: Atraumatic. Nose: No congestion/rhinnorhea. Mouth/Throat: Mucous membranes are moist.   Neck: Normal range of motion.  Cardiovascular: Normal rate, regular rhythm. Grossly normal heart sounds.  Good peripheral circulation. Respiratory: Normal respiratory effort.  No retractions. Lungs CTAB. Gastrointestinal: Soft and nontender. No distention.  Genitourinary: No flank tenderness. Musculoskeletal: No lower extremity edema.  Extremities warm and well perfused.  Neurologic: Mild left facial droop.  Left upper extremity mild ataxia.  5 out of 5 motor strength in all extremities.  No neglect.  Normal sensation all extremities.  Very mild dysarthria. Skin:  Skin is warm and dry. No rash noted. Psychiatric: Mood and affect are normal. Speech and behavior are normal.  ____________________________________________   LABS (all labs ordered are listed, but only abnormal results are displayed)  Labs Reviewed  COMPREHENSIVE METABOLIC PANEL - Abnormal; Notable for the following components:      Result Value   Glucose, Bld 204 (*)    All other components within normal limits  PROTIME-INR  APTT  CBC  DIFFERENTIAL  TROPONIN I  CBG MONITORING, ED   ____________________________________________  EKG  ED ECG REPORT I, [name] Bucy, the attending physician, personally viewed and  interpreted this ECG.  Date: 08/31/2017 EKG Time: 1119 Rate: 81 Rhythm: normal sinus rhythm QRS Axis: normal Intervals: normal ST/T Wave abnormalities: LVH, and T wave inversions V5 and V6 Narrative Interpretation: Nonspecific changes; new lateral T wave inversions when compared to EKG from 12/20/14  ____________________________________________  RADIOLOGY  CT head: No acute findings  ____________________________________________   PROCEDURES  Procedure(s) performed: No    Critical Care performed: No ____________________________________________   INITIAL IMPRESSION / ASSESSMENT AND PLAN / ED COURSE  Pertinent labs & imaging results that were available during my care of the patient were reviewed by me and considered in my medical decision making (see chart for details).  56 year old male with past medical history as noted above including CAD status post stenting more than 15 years ago (states no longer on Plavix and intermittently compliant with aspirin) presents with left  sided weakness for approximately 24 hours, associated with mild intermittent chest pain.  Review of past medical records in epic is noncontributory.  On exam, patient has left-sided facial droop and mild left-sided ataxia and appears to have slight dysarthria and word finding difficulty intermittently but is generally alert and answering questions appropriately.  NIH stroke scale is 4.  Patient is also significantly hypertensive, but other vital signs are normal.  Presentation is primarily concerning for acute CVA, however given his elevated blood pressure, hypertensive crisis is also a possible component.  I have lower suspicion for ACS.  Given that the symptoms started 24 hours ago, patient is out of the window for acute intervention; will obtain CT head, stroke and cardiac lab workup, give IV labetalol to control the blood pressure, and reassess.  If CT with no acute findings, consider MRI.  Plan for likely  admission for stroke workup and blood pressure control.    ----------------------------------------- 12:58 PM on 08/31/2017 -----------------------------------------  CT does not demonstrate any acute findings.  Patient's blood pressure is improved.  His symptoms remain the same.  I consulted Dr. Thad Rangereynolds from neurology over the phone, who agrees with the current management, and agrees with admitting the patient for further stroke workup.  No indication for emergent MRI from the ED given that patient is not eligible for acute intervention.  I explained the test results and plan of care to the patient and his wife, and I will sign the patient out to the hospitalist.  ____________________________________________   FINAL CLINICAL IMPRESSION(S) / ED DIAGNOSES  Final diagnoses:  Cerebrovascular accident (CVA), unspecified mechanism (HCC)  Hypertension, unspecified type      NEW MEDICATIONS STARTED DURING THIS VISIT:  This SmartLink is deprecated. Use AVSMEDLIST instead to display the medication list for a patient.   Note:  This document was prepared using Dragon voice recognition software and may include unintentional dictation errors.    [name] [name], [name] Jokerst, MD 08/31/17 1259

## 2017-08-31 NOTE — ED Triage Notes (Signed)
States began mid chest pain, L facial droop and grip weakness and trouble getting words at 10am yesterday. Speech clear but pause before able to answer. Grip L weakness and facial droop  noted.

## 2017-09-01 ENCOUNTER — Inpatient Hospital Stay
Admit: 2017-09-01 | Discharge: 2017-09-01 | Disposition: A | Discharge status: Home / Self Care | Payer: BLUE CROSS/BLUE SHIELD | Attending: Internal Medicine | Admitting: Internal Medicine

## 2017-09-01 DIAGNOSIS — I639 Cerebral infarction, unspecified: Principal | ICD-10-CM

## 2017-09-01 LAB — CBC
HCT: 48.3 % (ref 40.0–52.0)
Hemoglobin: 16.6 g/dL (ref 13.0–18.0)
MCH: 29 pg (ref 26.0–34.0)
MCHC: 34.4 g/dL (ref 32.0–36.0)
MCV: 84.3 fL (ref 80.0–100.0)
PLATELETS: 239 10*3/uL (ref 150–440)
RBC: 5.73 MIL/uL (ref 4.40–5.90)
RDW: 13.8 % (ref 11.5–14.5)
WBC: 8.6 10*3/uL (ref 3.8–10.6)

## 2017-09-01 LAB — BASIC METABOLIC PANEL
Anion gap: 9 (ref 5–15)
BUN: 14 mg/dL (ref 6–20)
CALCIUM: 9 mg/dL (ref 8.9–10.3)
CO2: 24 mmol/L (ref 22–32)
CREATININE: 0.79 mg/dL (ref 0.61–1.24)
Chloride: 104 mmol/L (ref 101–111)
GFR calc Af Amer: >60 mL/min (ref >60)
Glucose, Bld: 149 mg/dL — ABNORMAL HIGH (ref 65–99)
Potassium: 3.7 mmol/L (ref 3.5–5.1)
SODIUM: 137 mmol/L (ref 135–145)

## 2017-09-01 LAB — TROPONIN I

## 2017-09-01 MED ORDER — CLOPIDOGREL BISULFATE 75 MG PO TABS
75.0000 mg | ORAL_TABLET | Freq: Every day | ORAL | Status: DC
  Administered 2017-09-01 – 2017-09-02 (×2): 75 mg via ORAL
  Filled 2017-09-01 (×2): qty 1

## 2017-09-01 MED ORDER — ALPRAZOLAM 0.25 MG PO TABS
0.2500 mg | ORAL_TABLET | Freq: Two times a day (BID) | ORAL | Status: DC | PRN
  Administered 2017-09-01 (×2): 0.25 mg via ORAL
  Filled 2017-09-01 (×2): qty 1

## 2017-09-01 NOTE — Plan of Care (Signed)
Pt alert and oriented. No complaints of pain or discomfort. NIH remains 4. Will continue to monitor

## 2017-09-01 NOTE — Consult Note (Signed)
Referring Physician: Cherlynn Kaiser    Chief Complaint: Facial droop and left sided weakness  HPI: [name] [name] is an 56 y.o. male with a history of CAD who reports that in Friday morning he noted facial droop and left sided weakness.  Felt he was having some word finding difficulties as well.  When his symptoms did not resolve by the next day he presented for evaluation.  Initial NIHSS of 3.  Date last known well: Date: 08/30/2017 Time last known well: Time: 10:00 tPA Given: No: Outside time window  Past Medical History:  Diagnosis Date  . CAD (coronary artery disease)    s/p stenting  . GERD (gastroesophageal reflux disease)   . HLD (hyperlipidemia)   . HTN (hypertension)   . MI (myocardial infarction) (HCC) 2000    Past Surgical History:  Procedure Laterality Date  . CARDIAC CATHETERIZATION     [facility]  . CORONARY STENT PLACEMENT  9/00    Family History  Problem Relation Age of Onset  . Hypertension Mother   . Hyperlipidemia Mother   . Arthritis Mother   . Diabetes Mother   . Hypertension Father   . Hyperlipidemia Father   . Alcohol abuse Father   . Diabetes Father   . Colitis Unknown   . Heart disease Unknown    Social History:  reports that he quit smoking about 18 years ago. he has never used smokeless tobacco. He reports that he drinks about 25.0 oz of alcohol per week. He reports that he does not use drugs.  Allergies: No Known Allergies  Medications:  I have reviewed the patient's current medications. Prior to Admission:  Medications Prior to Admission  Medication Sig Dispense Refill Last Dose  . lisinopril (PRINIVIL,ZESTRIL) 10 MG tablet Take 10 mg by mouth daily.  0 08/31/2017 at 0800  . atenolol (TENORMIN) 25 MG tablet Take 0.5 tablets (12.5 mg total) by mouth daily. (Patient not taking: Reported on 08/31/2017) 15 tablet 0 Not Taking at Unknown time  . lisinopril-hydrochlorothiazide (PRINZIDE,ZESTORETIC) 20-12.5 MG per tablet Take 1 tablet by mouth daily. (Patient  not taking: Reported on 08/31/2017) 30 tablet 0 Not Taking at Unknown time  . ranitidine (ZANTAC) 300 MG tablet Take 1 tablet (300 mg total) by mouth daily. (Patient not taking: Reported on 08/31/2017) 30 tablet 5 Not Taking at Unknown time  ASA 81mg  daily  Scheduled: . aspirin EC  81 mg Oral Daily  . atorvastatin  40 mg Oral q1800  . docusate sodium  100 mg Oral BID  . enoxaparin (LOVENOX) injection  40 mg Subcutaneous Q24H  . fluticasone  1 spray Each Nare Daily  . lisinopril  20 mg Oral Daily    ROS: History obtained from the patient  General ROS: negative for - chills, fatigue, fever, night sweats, weight gain or weight loss Psychological ROS: negative for - behavioral disorder, hallucinations, memory difficulties, mood swings or suicidal ideation Ophthalmic ROS: negative for - blurry vision, double vision, eye pain or loss of vision ENT ROS: negative for - epistaxis, nasal discharge, oral lesions, sore throat, tinnitus or vertigo Allergy and Immunology ROS: negative for - hives or itchy/watery eyes Hematological and Lymphatic ROS: negative for - bleeding problems, bruising or swollen lymph nodes Endocrine ROS: negative for - galactorrhea, hair pattern changes, polydipsia/polyuria or temperature intolerance Respiratory ROS: negative for - cough, hemoptysis, shortness of breath or wheezing Cardiovascular ROS: chest pain Gastrointestinal ROS: negative for - abdominal pain, diarrhea, hematemesis, nausea/vomiting or stool incontinence Genito-Urinary ROS: negative for -  dysuria, hematuria, incontinence or urinary frequency/urgency Musculoskeletal ROS: negative for - joint swelling or muscular weakness Neurological ROS: as noted in HPI Dermatological ROS: negative for rash and skin lesion changes  Physical Examination: Blood pressure (!) 171/89, pulse 70, temperature 98.2 F (36.8 C), temperature source Oral, resp. rate (!) 21, height 5\' 10"  (1.778 m), weight 102.9 kg (226 lb 14.4 oz),  SpO2 98 %.  HEENT-  Normocephalic, no lesions, without obvious abnormality.  Normal external eye and conjunctiva.  Normal TM's bilaterally.  Normal auditory canals and external ears. Normal external nose, mucus membranes and septum.  Normal pharynx. Cardiovascular- S1, S2 normal, pulses palpable throughout   Lungs- chest clear, no wheezing, rales, normal symmetric air entry Abdomen- soft, non-tender; bowel sounds normal; no masses,  no organomegaly Extremities- no edema Lymph-no adenopathy palpable Musculoskeletal-no joint tenderness, deformity or swelling Skin-warm and dry, no hyperpigmentation, vitiligo, or suspicious lesions  Neurological Examination   Mental Status: Alert, oriented, thought content appropriate.  Speech fluent without evidence of aphasia.  Able to follow 3 step commands without difficulty. Cranial Nerves: II: Discs flat bilaterally; Visual fields grossly normal, pupils equal, round, reactive to light and accommodation III,IV, VI: ptosis not present, extra-ocular motions intact bilaterally V,VII: mild left facial droop, facial light touch sensation normal bilaterally VIII: hearing normal bilaterally IX,X: gag reflex present XI: bilateral shoulder shrug XII: midline tongue extension Motor: Right : Upper extremity   5/5    Left:     Upper extremity   5-/5  Lower extremity   5/5     Lower extremity   4+/5 Tone and bulk:normal tone throughout; no atrophy noted Sensory: Pinprick and light touch intact throughout, bilaterally Deep Tendon Reflexes: 2+ and symmetric with absent AJ's bilaterally Plantars: Right: downgoing   Left: upgoing Cerebellar: Dysmetria with finger-to-nose testing on the left and mild dysmetria with heel-to-shin testing on the left Gait: not tested due to safety concerns    Laboratory Studies:  Basic Metabolic Panel: Recent Labs  Lab 08/31/17 1123 09/01/17 0434  NA 136 137  K 4.2 3.7  CL 104 104  CO2 25 24  GLUCOSE 204* 149*  BUN 12 14   CREATININE 0.86 0.79  CALCIUM 8.9 9.0    Liver Function Tests: Recent Labs  Lab 08/31/17 1123  AST 31  ALT 34  ALKPHOS 87  BILITOT 0.8  PROT 6.7  ALBUMIN 3.9   No results for input(s): LIPASE, AMYLASE in the last 168 hours. No results for input(s): AMMONIA in the last 168 hours.  CBC: Recent Labs  Lab 08/31/17 1123 09/01/17 0434  WBC 8.0 8.6  NEUTROABS 5.7  --   HGB 16.9 16.6  HCT 49.6 48.3  MCV 84.3 84.3  PLT 252 239    Cardiac Enzymes: Recent Labs  Lab 08/31/17 1123 08/31/17 1619 08/31/17 2210 09/01/17 0434  TROPONINI <0.03 <0.03 <0.03 <0.03    BNP: Invalid input(s): POCBNP  CBG: No results for input(s): GLUCAP in the last 168 hours.  Microbiology: No results found for this or any previous visit.  Coagulation Studies: Recent Labs    08/31/17 1123  LABPROT 12.5  INR 0.94    Urinalysis: No results for input(s): COLORURINE, LABSPEC, PHURINE, GLUCOSEU, HGBUR, BILIRUBINUR, KETONESUR, PROTEINUR, UROBILINOGEN, NITRITE, LEUKOCYTESUR in the last 168 hours.  Invalid input(s): APPERANCEUR  Lipid Panel:    Component Value Date/Time   CHOL 277 (H) 08/31/2017 1619   TRIG 467 (H) 08/31/2017 1619   HDL 27 (L) 08/31/2017 1619   CHOLHDL 10.3  08/31/2017 1619   VLDL UNABLE TO CALCULATE IF TRIGLYCERIDE OVER 400 mg/dL 16/06/9603 5409   LDLCALC UNABLE TO CALCULATE IF TRIGLYCERIDE OVER 400 mg/dL 81/19/1478 2956    OZHY8M: No results found for: HGBA1C  Urine Drug Screen:  No results found for: LABOPIA, COCAINSCRNUR, LABBENZ, AMPHETMU, THCU, LABBARB  Alcohol Level: No results for input(s): ETH in the last 168 hours.  Other results: EKG: sinus rhythm at 81 bpm.  Imaging: Ct Head Wo Contrast  Result Date: 08/31/2017 CLINICAL DATA:  Left facial droop and grip weakness. EXAM: CT HEAD WITHOUT CONTRAST TECHNIQUE: Contiguous axial images were obtained from the base of the skull through the vertex without intravenous contrast. COMPARISON:  None FINDINGS: Brain:  No evidence of acute infarction, hemorrhage, hydrocephalus, extra-axial collection or mass lesion/mass effect. Vascular: No hyperdense vessel or unexpected calcification. Skull: Normal. Negative for fracture or focal lesion. Sinuses/Orbits: No acute finding. Other: None. IMPRESSION: 1. No acute intracranial abnormalities. Normal appearance of the brain. Electronically Signed   By: Signa Kell M.D.   On: 08/31/2017 12:14   Mr Maxine Glenn Head Wo Contrast  Result Date: 08/31/2017 CLINICAL DATA:  Left-sided weakness. Left facial droop. Slurred speech. EXAM: MRI HEAD WITHOUT CONTRAST MRA HEAD WITHOUT CONTRAST MRA NECK WITHOUT CONTRAST TECHNIQUE: Multiplanar, multiecho pulse sequences of the brain and surrounding structures were obtained without intravenous contrast. Angiographic images of the Circle of Willis were obtained using MRA technique without intravenous contrast. Angiographic images of the neck were obtained using MRA technique without intravenous contrast. Carotid stenosis measurements (when applicable) are obtained utilizing NASCET criteria, using the distal internal carotid diameter as the denominator. COMPARISON:  Head CT 08/31/2017 FINDINGS: MRI HEAD FINDINGS Brain: There is an acute right paramedian pontine infarct measuring 23 x 8 mm. No intracranial hemorrhage, mass, midline shift, or extra-axial fluid collection is identified. The ventricles and sulci are normal. There is no significant cerebral white matter disease. Vascular: Major intracranial vascular flow voids are preserved. Skull and upper cervical spine: Unremarkable bone marrow signal. Sinuses/Orbits: Unremarkable orbits. Paranasal sinuses and mastoid air cells are clear. Other: None. MRA HEAD FINDINGS The visualized distal vertebral arteries are widely patent to the basilar. PICA origins were not imaged. Patent right AICA and bilateral SCA origins are identified. The basilar artery is widely patent. The PCAs are patent without evidence of  significant stenosis. The internal carotid arteries are widely patent from skullbase to carotid termini. ACAs and MCAs are patent without evidence of proximal branch occlusion or significant proximal stenosis. There is fullness in the anterior communicating artery region. This may be secondary to the convergence of multiple mildly tortuous vessels in this location combined with mild artifact as a discrete aneurysm is not clearly demonstrated but is not excluded. 1.5-2 mm outpouchings are noted extending posteriorly from the right ICA terminus (series 7, image 81) and proximal left A2 segment (series 7, image 92). MRA NECK FINDINGS There is a standard 3 vessel aortic arch. Noncontrast technique limits assessment, with poor flow related enhancement in the brachiocephalic, subclavian, and proximal common carotid arteries. The mid and distal common carotid arteries are widely patent bilaterally. The cervical internal carotid arteries are also patent without evidence of significant stenosis. The vertebral arteries are patent with antegrade flow bilaterally. The V1 segments are not well evaluated, although the V2 segments are widely patent without evidence of stenosis or dissection. The proximal V3 segments are widely patent, while signal loss in the distal V3 and V4 segments is likely technical in nature. IMPRESSION: 1. Acute  right pontine infarct. 2. Patent circle of Willis without evidence of significant stenosis. 3. 1.5-2 mm outpouchings from the right ICA terminus and left A2 segment. These may represent tiny aneurysms versus artifact or origins of poorly resolved vessels. There is also prominence of the anterior communicating region without a discrete aneurysm. Nonemergent CTA is recommended for further evaluation. 4. No evidence of significant cervical carotid or vertebral artery stenosis, although the proximal vessels are poorly evaluated due to noncontrast technique. Electronically Signed   By: Sebastian AcheAllen  Grady M.D.    On: 08/31/2017 15:48   Mr Maxine GlennMra Neck Wo Contrast  Result Date: 08/31/2017 CLINICAL DATA:  Left-sided weakness. Left facial droop. Slurred speech. EXAM: MRI HEAD WITHOUT CONTRAST MRA HEAD WITHOUT CONTRAST MRA NECK WITHOUT CONTRAST TECHNIQUE: Multiplanar, multiecho pulse sequences of the brain and surrounding structures were obtained without intravenous contrast. Angiographic images of the Circle of Willis were obtained using MRA technique without intravenous contrast. Angiographic images of the neck were obtained using MRA technique without intravenous contrast. Carotid stenosis measurements (when applicable) are obtained utilizing NASCET criteria, using the distal internal carotid diameter as the denominator. COMPARISON:  Head CT 08/31/2017 FINDINGS: MRI HEAD FINDINGS Brain: There is an acute right paramedian pontine infarct measuring 23 x 8 mm. No intracranial hemorrhage, mass, midline shift, or extra-axial fluid collection is identified. The ventricles and sulci are normal. There is no significant cerebral white matter disease. Vascular: Major intracranial vascular flow voids are preserved. Skull and upper cervical spine: Unremarkable bone marrow signal. Sinuses/Orbits: Unremarkable orbits. Paranasal sinuses and mastoid air cells are clear. Other: None. MRA HEAD FINDINGS The visualized distal vertebral arteries are widely patent to the basilar. PICA origins were not imaged. Patent right AICA and bilateral SCA origins are identified. The basilar artery is widely patent. The PCAs are patent without evidence of significant stenosis. The internal carotid arteries are widely patent from skullbase to carotid termini. ACAs and MCAs are patent without evidence of proximal branch occlusion or significant proximal stenosis. There is fullness in the anterior communicating artery region. This may be secondary to the convergence of multiple mildly tortuous vessels in this location combined with mild artifact as a discrete  aneurysm is not clearly demonstrated but is not excluded. 1.5-2 mm outpouchings are noted extending posteriorly from the right ICA terminus (series 7, image 81) and proximal left A2 segment (series 7, image 92). MRA NECK FINDINGS There is a standard 3 vessel aortic arch. Noncontrast technique limits assessment, with poor flow related enhancement in the brachiocephalic, subclavian, and proximal common carotid arteries. The mid and distal common carotid arteries are widely patent bilaterally. The cervical internal carotid arteries are also patent without evidence of significant stenosis. The vertebral arteries are patent with antegrade flow bilaterally. The V1 segments are not well evaluated, although the V2 segments are widely patent without evidence of stenosis or dissection. The proximal V3 segments are widely patent, while signal loss in the distal V3 and V4 segments is likely technical in nature. IMPRESSION: 1. Acute right pontine infarct. 2. Patent circle of Willis without evidence of significant stenosis. 3. 1.5-2 mm outpouchings from the right ICA terminus and left A2 segment. These may represent tiny aneurysms versus artifact or origins of poorly resolved vessels. There is also prominence of the anterior communicating region without a discrete aneurysm. Nonemergent CTA is recommended for further evaluation. 4. No evidence of significant cervical carotid or vertebral artery stenosis, although the proximal vessels are poorly evaluated due to noncontrast technique. Electronically Signed  By: Sebastian Ache M.D.   On: 08/31/2017 15:48   Mr Brain Wo Contrast  Result Date: 08/31/2017 CLINICAL DATA:  Left-sided weakness. Left facial droop. Slurred speech. EXAM: MRI HEAD WITHOUT CONTRAST MRA HEAD WITHOUT CONTRAST MRA NECK WITHOUT CONTRAST TECHNIQUE: Multiplanar, multiecho pulse sequences of the brain and surrounding structures were obtained without intravenous contrast. Angiographic images of the Circle of Willis  were obtained using MRA technique without intravenous contrast. Angiographic images of the neck were obtained using MRA technique without intravenous contrast. Carotid stenosis measurements (when applicable) are obtained utilizing NASCET criteria, using the distal internal carotid diameter as the denominator. COMPARISON:  Head CT 08/31/2017 FINDINGS: MRI HEAD FINDINGS Brain: There is an acute right paramedian pontine infarct measuring 23 x 8 mm. No intracranial hemorrhage, mass, midline shift, or extra-axial fluid collection is identified. The ventricles and sulci are normal. There is no significant cerebral white matter disease. Vascular: Major intracranial vascular flow voids are preserved. Skull and upper cervical spine: Unremarkable bone marrow signal. Sinuses/Orbits: Unremarkable orbits. Paranasal sinuses and mastoid air cells are clear. Other: None. MRA HEAD FINDINGS The visualized distal vertebral arteries are widely patent to the basilar. PICA origins were not imaged. Patent right AICA and bilateral SCA origins are identified. The basilar artery is widely patent. The PCAs are patent without evidence of significant stenosis. The internal carotid arteries are widely patent from skullbase to carotid termini. ACAs and MCAs are patent without evidence of proximal branch occlusion or significant proximal stenosis. There is fullness in the anterior communicating artery region. This may be secondary to the convergence of multiple mildly tortuous vessels in this location combined with mild artifact as a discrete aneurysm is not clearly demonstrated but is not excluded. 1.5-2 mm outpouchings are noted extending posteriorly from the right ICA terminus (series 7, image 81) and proximal left A2 segment (series 7, image 92). MRA NECK FINDINGS There is a standard 3 vessel aortic arch. Noncontrast technique limits assessment, with poor flow related enhancement in the brachiocephalic, subclavian, and proximal common carotid  arteries. The mid and distal common carotid arteries are widely patent bilaterally. The cervical internal carotid arteries are also patent without evidence of significant stenosis. The vertebral arteries are patent with antegrade flow bilaterally. The V1 segments are not well evaluated, although the V2 segments are widely patent without evidence of stenosis or dissection. The proximal V3 segments are widely patent, while signal loss in the distal V3 and V4 segments is likely technical in nature. IMPRESSION: 1. Acute right pontine infarct. 2. Patent circle of Willis without evidence of significant stenosis. 3. 1.5-2 mm outpouchings from the right ICA terminus and left A2 segment. These may represent tiny aneurysms versus artifact or origins of poorly resolved vessels. There is also prominence of the anterior communicating region without a discrete aneurysm. Nonemergent CTA is recommended for further evaluation. 4. No evidence of significant cervical carotid or vertebral artery stenosis, although the proximal vessels are poorly evaluated due to noncontrast technique. Electronically Signed   By: Sebastian Ache M.D.   On: 08/31/2017 15:48    Assessment: 56 y.o. male with a history of CAD on ASA who presented with acute onset of facial droop and left sided weakness.  Patient hypertensive on presentation.  MRI of the brain reviewed and shows an acute right pontine infarct.  Etiology likely small vessel disease with patient's multiple small vessel risk factors.  MRA of head and neck shows no significant arterial stenosis but some question of small aneurysm.  LDL  unable to be calculated du to elevated lipids.  A1c pending.  Echocardiogram with an EF of 50% and no evidence of cardiac source of emboli or shunt.    Stroke Risk Factors - hyperlipidemia and hypertension  Plan: 1. PT consult, OT consult, Speech consult 2. Prophylactic therapy-ASA 81mg  and Plavix 75mg  daily 3. Telemetry monitoring 4. Frequent neuro  checks 5. Statin for lipid management with target LDL<70. 6. BP control  7. Patient to follow up with neurology on an outpatient basis where determination on long term antiplatelet therapy to be determined and patient may have follow up imaging in 6-12 months for possible aneurysms.     Thana Farr, MD Neurology [phone] 09/01/2017, 1:33 PM

## 2017-09-01 NOTE — Progress Notes (Signed)
*  PRELIMINARY RESULTS* Echocardiogram 2D Echocardiogram has been performed. A Saline Microcavitation (Bubble Study) was requested & completed.  Garrel Ridgelikeshia S Hillman Attig 09/01/2017, 12:10 PM

## 2017-09-01 NOTE — Plan of Care (Signed)
  Progressing Education: Knowledge of General Education information will improve 09/01/2017 1518 - Progressing by Donnel SaxonKennedy, Sherin Murdoch L, RN Education: Knowledge of disease or condition will improve 09/01/2017 1518 - Progressing by Donnel SaxonKennedy, Kenedi Cilia L, RN Knowledge of secondary prevention will improve 09/01/2017 1518 - Progressing by Donnel SaxonKennedy, Starlene Consuegra L, RN Knowledge of patient specific risk factors addressed and post discharge goals established will improve 09/01/2017 1518 - Progressing by Donnel SaxonKennedy, Tiffay Pinette L, RN Coping: Will identify appropriate support needs 09/01/2017 1518 - Progressing by Donnel SaxonKennedy, Daniela Siebers L, RN Health Behavior/Discharge Planning: Ability to manage health-related needs will improve 09/01/2017 1518 - Progressing by Donnel SaxonKennedy, Breonia Kirstein L, RN Self-Care: Ability to participate in self-care as condition permits will improve 09/01/2017 1518 - Progressing by Donnel SaxonKennedy, Haidyn Kilburg L, RN Ability to communicate needs accurately will improve 09/01/2017 1518 - Progressing by Donnel SaxonKennedy, Shakevia Sarris L, RN Nutrition: Risk of aspiration will decrease 09/01/2017 1518 - Progressing by Donnel SaxonKennedy, Ciel Yanes L, RN Ischemic Stroke/TIA Tissue Perfusion: Complications of ischemic stroke/TIA will be minimized 09/01/2017 1518 - Progressing by Donnel SaxonKennedy, Hikari Tripp L, RN

## 2017-09-01 NOTE — Progress Notes (Signed)
[facility]   PATIENT NAME: [name] [name]    MR#:  [id number]  DATE OF BIRTH:  Jan 22, 1961  SUBJECTIVE:   Patient here due to left-sided facial droop with left upper extremity weakness. Patient on MRI was noted to have a right-sided pontine infarct. Awaiting neurology consultation. No headache and no other associated symptoms presently.  REVIEW OF SYSTEMS:    Review of Systems  Constitutional: Negative for chills and fever.  HENT: Negative for congestion and tinnitus.   Eyes: Negative for blurred vision and double vision.  Respiratory: Negative for cough, shortness of breath and wheezing.   Cardiovascular: Negative for chest pain, orthopnea and PND.  Gastrointestinal: Negative for abdominal pain, diarrhea, nausea and vomiting.  Genitourinary: Negative for dysuria and hematuria.  Neurological: Positive for focal weakness (Left side weakness. ). Negative for dizziness and sensory change.  All other systems reviewed and are negative.   Nutrition: heart healthy Tolerating Diet: Yes Tolerating PT: Await Eval.    DRUG ALLERGIES:  No Known Allergies  VITALS:  Blood pressure (!) 171/89, pulse 70, temperature 98.2 F (36.8 C), temperature source Oral, resp. rate (!) 21, height 5\' 10"  (1.778 m), weight 102.9 kg (226 lb 14.4 oz), SpO2 98 %.  PHYSICAL EXAMINATION:   Physical Exam  GENERAL:  56 y.o.-year-old patient lying in bed in no acute distress.  EYES: Pupils equal, round, reactive to light and accommodation. No scleral icterus. Extraocular muscles intact.  HEENT: Head atraumatic, normocephalic. Oropharynx and nasopharynx clear.  NECK:  Supple, no jugular venous distention. No thyroid enlargement, no tenderness.  LUNGS: Normal breath sounds bilaterally, no wheezing, rales, rhonchi. No use of accessory muscles of respiration.  CARDIOVASCULAR: S1, S2 normal. No murmurs, rubs, or gallops.  ABDOMEN: Soft, nontender, nondistended. Bowel sounds  present. No organomegaly or mass.  EXTREMITIES: No cyanosis, clubbing or edema b/l.    NEUROLOGIC: Cranial nerves II through XII are intact. Left-sided facial droop, left upper extremity weakness about a 3 out of 5 strength.  PSYCHIATRIC: The patient is alert and oriented x 3.  SKIN: No obvious rash, lesion, or ulcer.    LABORATORY PANEL:   CBC Recent Labs  Lab 09/01/17 0434  WBC 8.6  HGB 16.6  HCT 48.3  PLT 239   ------------------------------------------------------------------------------------------------------------------  Chemistries  Recent Labs  Lab 08/31/17 1123 09/01/17 0434  NA 136 137  K 4.2 3.7  CL 104 104  CO2 25 24  GLUCOSE 204* 149*  BUN 12 14  CREATININE 0.86 0.79  CALCIUM 8.9 9.0  AST 31  --   ALT 34  --   ALKPHOS 87  --   BILITOT 0.8  --    ------------------------------------------------------------------------------------------------------------------  Cardiac Enzymes Recent Labs  Lab 09/01/17 0434  TROPONINI <0.03   ------------------------------------------------------------------------------------------------------------------  RADIOLOGY:  Ct Head Wo Contrast  Result Date: 08/31/2017 CLINICAL DATA:  Left facial droop and grip weakness. EXAM: CT HEAD WITHOUT CONTRAST TECHNIQUE: Contiguous axial images were obtained from the base of the skull through the vertex without intravenous contrast. COMPARISON:  None FINDINGS: Brain: No evidence of acute infarction, hemorrhage, hydrocephalus, extra-axial collection or mass lesion/mass effect. Vascular: No hyperdense vessel or unexpected calcification. Skull: Normal. Negative for fracture or focal lesion. Sinuses/Orbits: No acute finding. Other: None. IMPRESSION: 1. No acute intracranial abnormalities. Normal appearance of the brain. Electronically Signed   By: Signa Kell M.D.   On: 08/31/2017 12:14   Mr Maxine Glenn Head Wo Contrast  Result Date: 08/31/2017 CLINICAL DATA:  Left-sided weakness. Left  facial droop. Slurred speech. EXAM: MRI HEAD WITHOUT CONTRAST MRA HEAD WITHOUT CONTRAST MRA NECK WITHOUT CONTRAST TECHNIQUE: Multiplanar, multiecho pulse sequences of the brain and surrounding structures were obtained without intravenous contrast. Angiographic images of the Circle of Willis were obtained using MRA technique without intravenous contrast. Angiographic images of the neck were obtained using MRA technique without intravenous contrast. Carotid stenosis measurements (when applicable) are obtained utilizing NASCET criteria, using the distal internal carotid diameter as the denominator. COMPARISON:  Head CT 08/31/2017 FINDINGS: MRI HEAD FINDINGS Brain: There is an acute right paramedian pontine infarct measuring 23 x 8 mm. No intracranial hemorrhage, mass, midline shift, or extra-axial fluid collection is identified. The ventricles and sulci are normal. There is no significant cerebral white matter disease. Vascular: Major intracranial vascular flow voids are preserved. Skull and upper cervical spine: Unremarkable bone marrow signal. Sinuses/Orbits: Unremarkable orbits. Paranasal sinuses and mastoid air cells are clear. Other: None. MRA HEAD FINDINGS The visualized distal vertebral arteries are widely patent to the basilar. PICA origins were not imaged. Patent right AICA and bilateral SCA origins are identified. The basilar artery is widely patent. The PCAs are patent without evidence of significant stenosis. The internal carotid arteries are widely patent from skullbase to carotid termini. ACAs and MCAs are patent without evidence of proximal branch occlusion or significant proximal stenosis. There is fullness in the anterior communicating artery region. This may be secondary to the convergence of multiple mildly tortuous vessels in this location combined with mild artifact as a discrete aneurysm is not clearly demonstrated but is not excluded. 1.5-2 mm outpouchings are noted extending posteriorly from the  right ICA terminus (series 7, image 81) and proximal left A2 segment (series 7, image 92). MRA NECK FINDINGS There is a standard 3 vessel aortic arch. Noncontrast technique limits assessment, with poor flow related enhancement in the brachiocephalic, subclavian, and proximal common carotid arteries. The mid and distal common carotid arteries are widely patent bilaterally. The cervical internal carotid arteries are also patent without evidence of significant stenosis. The vertebral arteries are patent with antegrade flow bilaterally. The V1 segments are not well evaluated, although the V2 segments are widely patent without evidence of stenosis or dissection. The proximal V3 segments are widely patent, while signal loss in the distal V3 and V4 segments is likely technical in nature. IMPRESSION: 1. Acute right pontine infarct. 2. Patent circle of Willis without evidence of significant stenosis. 3. 1.5-2 mm outpouchings from the right ICA terminus and left A2 segment. These may represent tiny aneurysms versus artifact or origins of poorly resolved vessels. There is also prominence of the anterior communicating region without a discrete aneurysm. Nonemergent CTA is recommended for further evaluation. 4. No evidence of significant cervical carotid or vertebral artery stenosis, although the proximal vessels are poorly evaluated due to noncontrast technique. Electronically Signed   By: Sebastian AcheAllen  Grady M.D.   On: 08/31/2017 15:48   Mr Maxine GlennMra Neck Wo Contrast  Result Date: 08/31/2017 CLINICAL DATA:  Left-sided weakness. Left facial droop. Slurred speech. EXAM: MRI HEAD WITHOUT CONTRAST MRA HEAD WITHOUT CONTRAST MRA NECK WITHOUT CONTRAST TECHNIQUE: Multiplanar, multiecho pulse sequences of the brain and surrounding structures were obtained without intravenous contrast. Angiographic images of the Circle of Willis were obtained using MRA technique without intravenous contrast. Angiographic images of the neck were obtained using  MRA technique without intravenous contrast. Carotid stenosis measurements (when applicable) are obtained utilizing NASCET criteria, using the distal internal carotid diameter as the denominator. COMPARISON:  Head CT 08/31/2017 FINDINGS: MRI HEAD FINDINGS Brain: There is an acute right paramedian pontine infarct measuring 23 x 8 mm. No intracranial hemorrhage, mass, midline shift, or extra-axial fluid collection is identified. The ventricles and sulci are normal. There is no significant cerebral white matter disease. Vascular: Major intracranial vascular flow voids are preserved. Skull and upper cervical spine: Unremarkable bone marrow signal. Sinuses/Orbits: Unremarkable orbits. Paranasal sinuses and mastoid air cells are clear. Other: None. MRA HEAD FINDINGS The visualized distal vertebral arteries are widely patent to the basilar. PICA origins were not imaged. Patent right AICA and bilateral SCA origins are identified. The basilar artery is widely patent. The PCAs are patent without evidence of significant stenosis. The internal carotid arteries are widely patent from skullbase to carotid termini. ACAs and MCAs are patent without evidence of proximal branch occlusion or significant proximal stenosis. There is fullness in the anterior communicating artery region. This may be secondary to the convergence of multiple mildly tortuous vessels in this location combined with mild artifact as a discrete aneurysm is not clearly demonstrated but is not excluded. 1.5-2 mm outpouchings are noted extending posteriorly from the right ICA terminus (series 7, image 81) and proximal left A2 segment (series 7, image 92). MRA NECK FINDINGS There is a standard 3 vessel aortic arch. Noncontrast technique limits assessment, with poor flow related enhancement in the brachiocephalic, subclavian, and proximal common carotid arteries. The mid and distal common carotid arteries are widely patent bilaterally. The cervical internal carotid  arteries are also patent without evidence of significant stenosis. The vertebral arteries are patent with antegrade flow bilaterally. The V1 segments are not well evaluated, although the V2 segments are widely patent without evidence of stenosis or dissection. The proximal V3 segments are widely patent, while signal loss in the distal V3 and V4 segments is likely technical in nature. IMPRESSION: 1. Acute right pontine infarct. 2. Patent circle of Willis without evidence of significant stenosis. 3. 1.5-2 mm outpouchings from the right ICA terminus and left A2 segment. These may represent tiny aneurysms versus artifact or origins of poorly resolved vessels. There is also prominence of the anterior communicating region without a discrete aneurysm. Nonemergent CTA is recommended for further evaluation. 4. No evidence of significant cervical carotid or vertebral artery stenosis, although the proximal vessels are poorly evaluated due to noncontrast technique. Electronically Signed   By: Sebastian AcheAllen  Grady M.D.   On: 08/31/2017 15:48   Mr Brain Wo Contrast  Result Date: 08/31/2017 CLINICAL DATA:  Left-sided weakness. Left facial droop. Slurred speech. EXAM: MRI HEAD WITHOUT CONTRAST MRA HEAD WITHOUT CONTRAST MRA NECK WITHOUT CONTRAST TECHNIQUE: Multiplanar, multiecho pulse sequences of the brain and surrounding structures were obtained without intravenous contrast. Angiographic images of the Circle of Willis were obtained using MRA technique without intravenous contrast. Angiographic images of the neck were obtained using MRA technique without intravenous contrast. Carotid stenosis measurements (when applicable) are obtained utilizing NASCET criteria, using the distal internal carotid diameter as the denominator. COMPARISON:  Head CT 08/31/2017 FINDINGS: MRI HEAD FINDINGS Brain: There is an acute right paramedian pontine infarct measuring 23 x 8 mm. No intracranial hemorrhage, mass, midline shift, or extra-axial fluid  collection is identified. The ventricles and sulci are normal. There is no significant cerebral white matter disease. Vascular: Major intracranial vascular flow voids are preserved. Skull and upper cervical spine: Unremarkable bone marrow signal. Sinuses/Orbits: Unremarkable orbits. Paranasal sinuses and mastoid air cells are clear. Other: None. MRA HEAD FINDINGS The visualized distal vertebral arteries are  widely patent to the basilar. PICA origins were not imaged. Patent right AICA and bilateral SCA origins are identified. The basilar artery is widely patent. The PCAs are patent without evidence of significant stenosis. The internal carotid arteries are widely patent from skullbase to carotid termini. ACAs and MCAs are patent without evidence of proximal branch occlusion or significant proximal stenosis. There is fullness in the anterior communicating artery region. This may be secondary to the convergence of multiple mildly tortuous vessels in this location combined with mild artifact as a discrete aneurysm is not clearly demonstrated but is not excluded. 1.5-2 mm outpouchings are noted extending posteriorly from the right ICA terminus (series 7, image 81) and proximal left A2 segment (series 7, image 92). MRA NECK FINDINGS There is a standard 3 vessel aortic arch. Noncontrast technique limits assessment, with poor flow related enhancement in the brachiocephalic, subclavian, and proximal common carotid arteries. The mid and distal common carotid arteries are widely patent bilaterally. The cervical internal carotid arteries are also patent without evidence of significant stenosis. The vertebral arteries are patent with antegrade flow bilaterally. The V1 segments are not well evaluated, although the V2 segments are widely patent without evidence of stenosis or dissection. The proximal V3 segments are widely patent, while signal loss in the distal V3 and V4 segments is likely technical in nature. IMPRESSION: 1.  Acute right pontine infarct. 2. Patent circle of Willis without evidence of significant stenosis. 3. 1.5-2 mm outpouchings from the right ICA terminus and left A2 segment. These may represent tiny aneurysms versus artifact or origins of poorly resolved vessels. There is also prominence of the anterior communicating region without a discrete aneurysm. Nonemergent CTA is recommended for further evaluation. 4. No evidence of significant cervical carotid or vertebral artery stenosis, although the proximal vessels are poorly evaluated due to noncontrast technique. Electronically Signed   By: Sebastian Ache M.D.   On: 08/31/2017 15:48     ASSESSMENT AND PLAN:   Aryn Kops  is a 56 y.o. male with a known history of CAD status post stent about 18 years ago, hypertension not taking any medications currently presents to hospital secondary to weakness, left facial droop since yesterday.  #1 acute CVA- this is the cause of patient's left-sided facial droop and left upper extremity weakness. -MRI of the brain confirmed a right-sided pontine infarct. -Continue aspirin, atorvastatin. Await neurology consult. Await physical therapy and occupation therapy evaluation.  2. CAD s/p stents- stable, several years ago - On aspirin and statin.  No acute symptoms or chest pain.   3. Hypertension-blood pressure improved since yesterday. -Continue lisinopril, IV hydralazine/labetalol as needed.  4. Hyperlipidemia-continue atorvastatin. Patient's total cholesterol was over 200.  5. Anxiety-continue Xanax at bedtime.     All the records are reviewed and case discussed with Care Management/Social Worker. Management plans discussed with the patient, family and they are in agreement.  CODE STATUS: Full code  DVT Prophylaxis: Lovenox  TOTAL TIME TAKING CARE OF THIS PATIENT: 30 minutes.   POSSIBLE D/C IN 1-2 DAYS, DEPENDING ON CLINICAL CONDITION.   Houston Siren M.D on 09/01/2017 at 12:12 PM  Between 7am  to 6pm - Pager - [phone]  After 6pm go to [email or web address] - Social research officer, government  [facility] [facility]  Office  [phone]  CC: Primary care physician; Patient, No Pcp Per

## 2017-09-02 ENCOUNTER — Encounter: Payer: Self-pay | Admitting: *Deleted

## 2017-09-02 LAB — HIV ANTIBODY (ROUTINE TESTING W REFLEX): HIV SCREEN 4TH GENERATION: NONREACTIVE

## 2017-09-02 MED ORDER — LISINOPRIL 20 MG PO TABS: 20.0000 mg | ORAL_TABLET | Freq: Every day | ORAL | 1 refills | Status: DC

## 2017-09-02 MED ORDER — ATORVASTATIN CALCIUM 40 MG PO TABS: 40.0000 mg | ORAL_TABLET | Freq: Every day | ORAL | 1 refills | Status: DC

## 2017-09-02 MED ORDER — CLOPIDOGREL BISULFATE 75 MG PO TABS: 75.0000 mg | ORAL_TABLET | Freq: Every day | ORAL | 1 refills | Status: DC

## 2017-09-02 MED ORDER — ATENOLOL 25 MG PO TABS: 25.0000 mg | ORAL_TABLET | Freq: Every day | ORAL | 1 refills | Status: DC

## 2017-09-02 NOTE — Discharge Summary (Signed)
[facility] - [place] at [facility]   PATIENT NAME: [name] [name]    MR#:  [id number]  DATE OF BIRTH:  [birth year]  DATE OF ADMISSION:  08/31/2017 ADMITTING PHYSICIAN: Enid Baas, MD  DATE OF DISCHARGE: 09/02/2017 11:55 AM  PRIMARY CARE PHYSICIAN: Patient, No Pcp Per    ADMISSION DIAGNOSIS:  Cerebrovascular accident (CVA), unspecified mechanism (HCC) [I63.9] Hypertension, unspecified type [I10]  DISCHARGE DIAGNOSIS:  Active Problems:   CVA (cerebral vascular accident) (HCC)   SECONDARY DIAGNOSIS:   Past Medical History:  Diagnosis Date  . CAD (coronary artery disease)    s/p stenting  . GERD (gastroesophageal reflux disease)   . HLD (hyperlipidemia)   . HTN (hypertension)   . MI (myocardial infarction) [facility]) 2000    HOSPITAL COURSE:   RickyCravenis a56 y.o.malewith a known history of CAD status post stent about 18 years ago, hypertension not taking any medications currently presents to hospital secondary to weakness, left facial droop since yesterday.  #1 acute CVA- this was the cause of patient's left-sided facial droop and left upper extremity weakness. - pt's MRI of the brain confirmed a right-sided pontine infarct. -Patient was seen by neurology and he recommended dual antiplatelet therapy with aspirin and Plavix. Patient was also placed on a high dose intensity statin with atorvastatin. A physical therapy and occupational therapy consultation was obtained and they recommended outpatient services but the patient declined those. -Patient presently is being discharged on aspirin, Plavix atorvastatin.  2. CAD s/p stents- stable, several years ago - Patient had no acute chest pain while in the hospital, he will resume his aspirin, statin upon discharge.  3. Hypertension-patient was not taking any blood pressure minutes prior to coming to the hospital. Patient was discharged on low-dose lisinopril and atenolol.  4.  Hyperlipidemia-Patient's total cholesterol was over 200 and he was discharged on Atorvastatin   DISCHARGE CONDITIONS:   Stable  CONSULTS OBTAINED:  Treatment Team:  Thana Farr, MD  DRUG ALLERGIES:  No Known Allergies  DISCHARGE MEDICATIONS:   Allergies as of 09/02/2017   No Known Allergies     Medication List    STOP taking these medications   lisinopril-hydrochlorothiazide 20-12.5 MG tablet Commonly known as:  PRINZIDE,ZESTORETIC   ranitidine 300 MG tablet Commonly known as:  ZANTAC     TAKE these medications   atenolol 25 MG tablet Commonly known as:  TENORMIN Take 1 tablet (25 mg total) by mouth daily. What changed:  how much to take   atorvastatin 40 MG tablet Commonly known as:  LIPITOR Take 1 tablet (40 mg total) by mouth daily at 6 PM.   clopidogrel 75 MG tablet Commonly known as:  PLAVIX Take 1 tablet (75 mg total) by mouth daily. Start taking on:  09/03/2017   lisinopril 20 MG tablet Commonly known as:  PRINIVIL,ZESTRIL Take 1 tablet (20 mg total) by mouth daily. What changed:    medication strength  how much to take         DISCHARGE INSTRUCTIONS:   DIET:  Cardiac diet  DISCHARGE CONDITION:  Good  ACTIVITY:  Activity as tolerated  OXYGEN:  Home Oxygen: No.   Oxygen Delivery: room air  DISCHARGE LOCATION:  home   If you experience worsening of your admission symptoms, develop shortness of breath, life threatening emergency, suicidal or homicidal thoughts you must seek medical attention immediately by calling 911 or calling your MD immediately  if symptoms less severe.  You Must read complete instructions/literature along with all the  possible adverse reactions/side effects for all the Medicines you take and that have been prescribed to you. Take any new Medicines after you have completely understood and accpet all the possible adverse reactions/side effects.   Please note  You were cared for by a hospitalist during  your hospital stay. If you have any questions about your discharge medications or the care you received while you were in the hospital after you are discharged, you can call the unit and asked to speak with the hospitalist on call if the hospitalist that took care of you is not available. Once you are discharged, your primary care physician will handle any further medical issues. Please note that NO REFILLS for any discharge medications will be authorized once you are discharged, as it is imperative that you return to your primary care physician (or establish a relationship with a primary care physician if you do not have one) for your aftercare needs so that they can reassess your need for medications and monitor your lab values.     Today   Still has some  left arm weakness and left-sided facial droop. No headache, nausea, vomiting or any other associated symptoms. Discharge home today.  VITAL SIGNS:  Blood pressure (!) 160/97, pulse 68, temperature 98.7 F (37.1 C), temperature source Oral, resp. rate 16, height 5\' 10"  (1.778 m), weight 102.9 kg (226 lb 14.4 oz), SpO2 96 %.  I/O:    Intake/Output Summary (Last 24 hours) at 09/02/2017 1339 Last data filed at 09/02/2017 1050 Gross per 24 hour  Intake 240 ml  Output -  Net 240 ml    PHYSICAL EXAMINATION:   GENERAL:  56 y.o.-year-old patient lying in bed in no acute distress.  EYES: Pupils equal, round, reactive to light and accommodation. No scleral icterus. Extraocular muscles intact.  HEENT: Head atraumatic, normocephalic. Oropharynx and nasopharynx clear.  NECK:  Supple, no jugular venous distention. No thyroid enlargement, no tenderness.  LUNGS: Normal breath sounds bilaterally, no wheezing, rales, rhonchi. No use of accessory muscles of respiration.  CARDIOVASCULAR: S1, S2 normal. No murmurs, rubs, or gallops.  ABDOMEN: Soft, nontender, nondistended. Bowel sounds present. No organomegaly or mass.  EXTREMITIES: No cyanosis,  clubbing or edema b/l.    NEUROLOGIC: Cranial nerves II through XII are intact. Left-sided facial droop, left upper extremity weakness about a 4 out of 5 strength.  PSYCHIATRIC: The patient is alert and oriented x 3.  SKIN: No obvious rash, lesion, or ulcer.     DATA REVIEW:   CBC Recent Labs  Lab 09/01/17 0434  WBC 8.6  HGB 16.6  HCT 48.3  PLT 239    Chemistries  Recent Labs  Lab 08/31/17 1123 09/01/17 0434  NA 136 137  K 4.2 3.7  CL 104 104  CO2 25 24  GLUCOSE 204* 149*  BUN 12 14  CREATININE 0.86 0.79  CALCIUM 8.9 9.0  AST 31  --   ALT 34  --   ALKPHOS 87  --   BILITOT 0.8  --     Cardiac Enzymes Recent Labs  Lab 09/01/17 0434  TROPONINI <0.03    Microbiology Results  No results found for this or any previous visit.  RADIOLOGY:  Mr Shirlee Latch ZO Contrast  Result Date: 08/31/2017 CLINICAL DATA:  Left-sided weakness. Left facial droop. Slurred speech. EXAM: MRI HEAD WITHOUT CONTRAST MRA HEAD WITHOUT CONTRAST MRA NECK WITHOUT CONTRAST TECHNIQUE: Multiplanar, multiecho pulse sequences of the brain and surrounding structures were obtained without intravenous contrast. Angiographic  images of the Circle of Willis were obtained using MRA technique without intravenous contrast. Angiographic images of the neck were obtained using MRA technique without intravenous contrast. Carotid stenosis measurements (when applicable) are obtained utilizing NASCET criteria, using the distal internal carotid diameter as the denominator. COMPARISON:  Head CT 08/31/2017 FINDINGS: MRI HEAD FINDINGS Brain: There is an acute right paramedian pontine infarct measuring 23 x 8 mm. No intracranial hemorrhage, mass, midline shift, or extra-axial fluid collection is identified. The ventricles and sulci are normal. There is no significant cerebral white matter disease. Vascular: Major intracranial vascular flow voids are preserved. Skull and upper cervical spine: Unremarkable bone marrow signal.  Sinuses/Orbits: Unremarkable orbits. Paranasal sinuses and mastoid air cells are clear. Other: None. MRA HEAD FINDINGS The visualized distal vertebral arteries are widely patent to the basilar. PICA origins were not imaged. Patent right AICA and bilateral SCA origins are identified. The basilar artery is widely patent. The PCAs are patent without evidence of significant stenosis. The internal carotid arteries are widely patent from skullbase to carotid termini. ACAs and MCAs are patent without evidence of proximal branch occlusion or significant proximal stenosis. There is fullness in the anterior communicating artery region. This may be secondary to the convergence of multiple mildly tortuous vessels in this location combined with mild artifact as a discrete aneurysm is not clearly demonstrated but is not excluded. 1.5-2 mm outpouchings are noted extending posteriorly from the right ICA terminus (series 7, image 81) and proximal left A2 segment (series 7, image 92). MRA NECK FINDINGS There is a standard 3 vessel aortic arch. Noncontrast technique limits assessment, with poor flow related enhancement in the brachiocephalic, subclavian, and proximal common carotid arteries. The mid and distal common carotid arteries are widely patent bilaterally. The cervical internal carotid arteries are also patent without evidence of significant stenosis. The vertebral arteries are patent with antegrade flow bilaterally. The V1 segments are not well evaluated, although the V2 segments are widely patent without evidence of stenosis or dissection. The proximal V3 segments are widely patent, while signal loss in the distal V3 and V4 segments is likely technical in nature. IMPRESSION: 1. Acute right pontine infarct. 2. Patent circle of Willis without evidence of significant stenosis. 3. 1.5-2 mm outpouchings from the right ICA terminus and left A2 segment. These may represent tiny aneurysms versus artifact or origins of poorly  resolved vessels. There is also prominence of the anterior communicating region without a discrete aneurysm. Nonemergent CTA is recommended for further evaluation. 4. No evidence of significant cervical carotid or vertebral artery stenosis, although the proximal vessels are poorly evaluated due to noncontrast technique. Electronically Signed   By: Sebastian AcheAllen  Grady M.D.   On: 08/31/2017 15:48   Mr Maxine GlennMra Neck Wo Contrast  Result Date: 08/31/2017 CLINICAL DATA:  Left-sided weakness. Left facial droop. Slurred speech. EXAM: MRI HEAD WITHOUT CONTRAST MRA HEAD WITHOUT CONTRAST MRA NECK WITHOUT CONTRAST TECHNIQUE: Multiplanar, multiecho pulse sequences of the brain and surrounding structures were obtained without intravenous contrast. Angiographic images of the Circle of Willis were obtained using MRA technique without intravenous contrast. Angiographic images of the neck were obtained using MRA technique without intravenous contrast. Carotid stenosis measurements (when applicable) are obtained utilizing NASCET criteria, using the distal internal carotid diameter as the denominator. COMPARISON:  Head CT 08/31/2017 FINDINGS: MRI HEAD FINDINGS Brain: There is an acute right paramedian pontine infarct measuring 23 x 8 mm. No intracranial hemorrhage, mass, midline shift, or extra-axial fluid collection is identified. The ventricles and sulci  are normal. There is no significant cerebral white matter disease. Vascular: Major intracranial vascular flow voids are preserved. Skull and upper cervical spine: Unremarkable bone marrow signal. Sinuses/Orbits: Unremarkable orbits. Paranasal sinuses and mastoid air cells are clear. Other: None. MRA HEAD FINDINGS The visualized distal vertebral arteries are widely patent to the basilar. PICA origins were not imaged. Patent right AICA and bilateral SCA origins are identified. The basilar artery is widely patent. The PCAs are patent without evidence of significant stenosis. The internal  carotid arteries are widely patent from skullbase to carotid termini. ACAs and MCAs are patent without evidence of proximal branch occlusion or significant proximal stenosis. There is fullness in the anterior communicating artery region. This may be secondary to the convergence of multiple mildly tortuous vessels in this location combined with mild artifact as a discrete aneurysm is not clearly demonstrated but is not excluded. 1.5-2 mm outpouchings are noted extending posteriorly from the right ICA terminus (series 7, image 81) and proximal left A2 segment (series 7, image 92). MRA NECK FINDINGS There is a standard 3 vessel aortic arch. Noncontrast technique limits assessment, with poor flow related enhancement in the brachiocephalic, subclavian, and proximal common carotid arteries. The mid and distal common carotid arteries are widely patent bilaterally. The cervical internal carotid arteries are also patent without evidence of significant stenosis. The vertebral arteries are patent with antegrade flow bilaterally. The V1 segments are not well evaluated, although the V2 segments are widely patent without evidence of stenosis or dissection. The proximal V3 segments are widely patent, while signal loss in the distal V3 and V4 segments is likely technical in nature. IMPRESSION: 1. Acute right pontine infarct. 2. Patent circle of Willis without evidence of significant stenosis. 3. 1.5-2 mm outpouchings from the right ICA terminus and left A2 segment. These may represent tiny aneurysms versus artifact or origins of poorly resolved vessels. There is also prominence of the anterior communicating region without a discrete aneurysm. Nonemergent CTA is recommended for further evaluation. 4. No evidence of significant cervical carotid or vertebral artery stenosis, although the proximal vessels are poorly evaluated due to noncontrast technique. Electronically Signed   By: Sebastian Ache M.D.   On: 08/31/2017 15:48   Mr Brain  Wo Contrast  Result Date: 08/31/2017 CLINICAL DATA:  Left-sided weakness. Left facial droop. Slurred speech. EXAM: MRI HEAD WITHOUT CONTRAST MRA HEAD WITHOUT CONTRAST MRA NECK WITHOUT CONTRAST TECHNIQUE: Multiplanar, multiecho pulse sequences of the brain and surrounding structures were obtained without intravenous contrast. Angiographic images of the Circle of Willis were obtained using MRA technique without intravenous contrast. Angiographic images of the neck were obtained using MRA technique without intravenous contrast. Carotid stenosis measurements (when applicable) are obtained utilizing NASCET criteria, using the distal internal carotid diameter as the denominator. COMPARISON:  Head CT 08/31/2017 FINDINGS: MRI HEAD FINDINGS Brain: There is an acute right paramedian pontine infarct measuring 23 x 8 mm. No intracranial hemorrhage, mass, midline shift, or extra-axial fluid collection is identified. The ventricles and sulci are normal. There is no significant cerebral white matter disease. Vascular: Major intracranial vascular flow voids are preserved. Skull and upper cervical spine: Unremarkable bone marrow signal. Sinuses/Orbits: Unremarkable orbits. Paranasal sinuses and mastoid air cells are clear. Other: None. MRA HEAD FINDINGS The visualized distal vertebral arteries are widely patent to the basilar. PICA origins were not imaged. Patent right AICA and bilateral SCA origins are identified. The basilar artery is widely patent. The PCAs are patent without evidence of significant stenosis. The internal carotid  arteries are widely patent from skullbase to carotid termini. ACAs and MCAs are patent without evidence of proximal branch occlusion or significant proximal stenosis. There is fullness in the anterior communicating artery region. This may be secondary to the convergence of multiple mildly tortuous vessels in this location combined with mild artifact as a discrete aneurysm is not clearly demonstrated  but is not excluded. 1.5-2 mm outpouchings are noted extending posteriorly from the right ICA terminus (series 7, image 81) and proximal left A2 segment (series 7, image 92). MRA NECK FINDINGS There is a standard 3 vessel aortic arch. Noncontrast technique limits assessment, with poor flow related enhancement in the brachiocephalic, subclavian, and proximal common carotid arteries. The mid and distal common carotid arteries are widely patent bilaterally. The cervical internal carotid arteries are also patent without evidence of significant stenosis. The vertebral arteries are patent with antegrade flow bilaterally. The V1 segments are not well evaluated, although the V2 segments are widely patent without evidence of stenosis or dissection. The proximal V3 segments are widely patent, while signal loss in the distal V3 and V4 segments is likely technical in nature. IMPRESSION: 1. Acute right pontine infarct. 2. Patent circle of Willis without evidence of significant stenosis. 3. 1.5-2 mm outpouchings from the right ICA terminus and left A2 segment. These may represent tiny aneurysms versus artifact or origins of poorly resolved vessels. There is also prominence of the anterior communicating region without a discrete aneurysm. Nonemergent CTA is recommended for further evaluation. 4. No evidence of significant cervical carotid or vertebral artery stenosis, although the proximal vessels are poorly evaluated due to noncontrast technique. Electronically Signed   By: Sebastian AcheAllen  Grady M.D.   On: 08/31/2017 15:48      Management plans discussed with the patient, family and they are in agreement.  CODE STATUS:     Code Status Orders  (From admission, onward)        Start     Ordered   08/31/17 1617  Full code  Continuous     08/31/17 1616    Code Status History    Date Active Date Inactive Code Status Order ID Comments User Context   This patient has a current code status but no historical code status.       TOTAL TIME TAKING CARE OF THIS PATIENT: 40 minutes.    [place] SirenSAINANI,Anhthu Perdew J M.D on 09/02/2017 at 1:39 PM  Between 7am to 6pm - Pager - [phone]  After 6pm go to [email or web address] - Social research officer, governmentpassword [email or web address]  [facility] [place] [facility]  Office  [phone]  CC: Primary care physician; Patient, No Pcp Per

## 2017-09-02 NOTE — Care Management Note (Addendum)
Case Management Note  Patient Details  Name: [name] KluverRicky [name] MRN: [id number] Date of Birth: 1961/03/08  Subjective/Objective: Admitted to [facility] with the diagnosis of CVA. Lives with wife. No Primary care physician listed. States he hasn't seen a private physician in a while. Private insurance.  MR+.                   Action/Plan: Physical and Occupational therapy evaluations completed. Recommending outpatient therapy. Explained that he would need to have a primary to sign referral. Reference material on physicians accepting new patients given Will fax outpatient referral down to rehab department. [name] [name]'s telephone # [phone]   Expected Discharge Date:  09/02/17               Expected Discharge Plan:     In-House Referral:   yes  Discharge planning Services     Post Acute Care Choice:    Choice offered to:     DME Arranged:    DME Agency:     HH Arranged:    HH Agency:     Status of Service:     If discussed at [employer] Meetings, dates discussed:    Additional Comments:  [name] GreetBrenda S Marcia Hartwell, RN MSN CCM Care Management [phone] 09/02/2017, 10:37 AM

## 2017-09-02 NOTE — Progress Notes (Signed)
SLP Cancellation Note  Patient Details Name: [name] [name] MRN: [id number] DOB: 1961/02/10   Cancelled treatment:       Reason Eval/Treat Not Completed: SLP screened, no needs identified, will sign off(chart reviewed; consulted NSG then met w/ pt/family). Pt denied any difficulty swallowing and is currently on a regular diet; tolerates swallowing pills w/ water per NSG. Pt conversed at conversational level w/out deficits noted; pt and family denied any speech-language deficits.  No further skilled ST services indicated as pt appears at his baseline. Pt agreed. NSG to reconsult if any change in status.      Orinda Kenner, MS, CCC-SLP Hayleen Clinkscales 09/02/2017, 11:06 AM

## 2017-09-02 NOTE — Evaluation (Signed)
Physical Therapy Evaluation Patient Details Name: [name] KluverRicky [name] MRN: [id number] DOB: April 23, 1961 Today's Date: 09/02/2017   History of Present Illness  Pt admitted for R pontine infarct. Pt with complaints of weakness on L side. History includes CAD s/p stent, GERD, HTN, and MI. Pt previously very independent prior to admission.  Clinical Impression  Pt is a pleasant 56 year old male who was admitted for CVA. Pt performs bed mobility/transfers with independence and ambulation with supervision and no AD. Pt with limited insight into deficits, needs cues for impulsive nature. Pt demonstrates deficits with L side strength/balance/coordination. Sensation intact. Wants to recover at home, not interested in further rehabilitation. Would benefit from skilled PT to address above deficits and promote optimal return to PLOF.      Follow Up Recommendations Outpatient PT(pt currently refusing)    Equipment Recommendations  None recommended by PT    Recommendations for Other Services       Precautions / Restrictions Precautions Precautions: Fall Restrictions Weight Bearing Restrictions: No      Mobility  Bed Mobility Overal bed mobility: Independent             General bed mobility comments: safe technqiue with upright posture  Transfers Overall transfer level: Independent Equipment used: None             General transfer comment: quick to stand with slight buckling on L side. Returned to sitting per therapist cues and performed 5 time sit<>stand with improved technique   Ambulation/Gait Ambulation/Gait assistance: Supervision Ambulation Distance (Feet): 40 Feet Assistive device: None Gait Pattern/deviations: WFL(Within Functional Limits)     General Gait Details: prior to ambulation, side stepped and marched in place. Pt then ambulated with close guarding with improved balance and less buckling.   Stairs            Wheelchair Mobility    Modified Rankin (Stroke  Patients Only)       Balance Overall balance assessment: (leaning towards L due to weakness)                                           Pertinent Vitals/Pain Pain Assessment: No/denies pain    Home Living Family/patient expects to be discharged to:: Private residence Living Arrangements: Spouse/significant other Available Help at Discharge: Available PRN/intermittently Type of Home: House Home Access: Stairs to enter Entrance Stairs-Rails: Right Entrance Stairs-Number of Steps: 3 Home Layout: One level;Laundry or work area in [employer] Insurance[employer]ement Home Equipment: None      Prior Function Level of Independence: Independent         Comments: active, no falls report     Hand Dominance        Extremity/Trunk Assessment   Upper Extremity Assessment Upper Extremity Assessment: LUE deficits/detail LUE Deficits / Details: grossly 4+/5 LUE Coordination: decreased fine motor    Lower Extremity Assessment Lower Extremity Assessment: LLE deficits/detail LLE Deficits / Details: grossly 4/5 LLE Sensation: (equal bilaterally) LLE Coordination: decreased gross motor       Communication   Communication: No difficulties  Cognition Arousal/Alertness: Awake/alert Behavior During Therapy: Flat affect Overall Cognitive Status: Within Functional Limits for tasks assessed                                        General Comments  Exercises     Assessment/Plan    PT Assessment Patient needs continued PT services  PT Problem List Decreased strength;Decreased balance;Decreased mobility       PT Treatment Interventions DME instruction;Gait training;Balance training    PT Goals (Current goals can be found in the Care Plan section)  Acute Rehab PT Goals Patient Stated Goal: to get stronger PT Goal Formulation: With patient Time For Goal Achievement: 09/16/17 Potential to Achieve Goals: Good    Frequency 7X/week   Barriers to discharge         Co-evaluation               AM-PAC PT "6 Clicks" Daily Activity  Outcome Measure Difficulty turning over in bed (including adjusting bedclothes, sheets and blankets)?: None Difficulty moving from lying on back to sitting on the side of the bed? : None Difficulty sitting down on and standing up from a chair with arms (e.g., wheelchair, bedside commode, etc,.)?: None Help needed moving to and from a bed to chair (including a wheelchair)?: None Help needed walking in hospital room?: A Little Help needed climbing 3-5 steps with a railing? : A Little 6 Click Score: 22    End of Session Equipment Utilized During Treatment: Gait belt Activity Tolerance: Patient tolerated treatment well Patient left: in bed;with family/visitor present Nurse Communication: Mobility status PT Visit Diagnosis: Difficulty in walking, not elsewhere classified (R26.2);Unsteadiness on feet (R26.81)    Time: [id number] PT Time Calculation (min) (ACUTE ONLY): 15 min   Charges:   PT Evaluation $PT Eval Low Complexity: 1 Low PT Treatments $Therapeutic Activity: 8-22 mins   PT G Codes:   PT G-Codes **NOT FOR INPATIENT CLASS** Functional Assessment Tool Used: AM-PAC 6 Clicks Basic Mobility Functional Limitation: Mobility: Walking and moving around Mobility: Walking and Moving Around Current Status ([id number]): At least 20 percent but less than 40 percent impaired, limited or restricted Mobility: Walking and Moving Around Goal Status [phone]): At least 1 percent but less than 20 percent impaired, limited or restricted    Elizabeth PalauStephanie Harlea Goetzinger, PT, DPT [phone]   [name] [name] 09/02/2017, 10:22 AM

## 2017-09-02 NOTE — Evaluation (Signed)
Occupational Therapy Evaluation Patient Details Name: [name] KluverRicky Hyun MRN: [id number] DOB: 1961/01/04 Today's Date: 09/02/2017    History of Present Illness Pt admitted for R pontine infarct. Pt with complaints of weakness on L side. History includes CAD s/p stent, GERD, HTN, and MI. Pt previously very independent prior to admission.   Clinical Impression   Pt seen for OT evaluation this date. Prior to admission, pt was independent, working, and lives with spouse. Pt eager to return to PLOF. Currently pt demonstrates L sided deficits in strength and coordination, mild L facial droop, mild dizziness upon standing (resolves quickly), and decreased smoothness with vertical and horizontal visual tracking. This impairments are impacting performance in ADL tasks, particularly fine motor tasks.  Pt grossly at supervision to modified independent level for ADL tasks, requiring additional effort/time to perform ADL, with spouse able to provide assist as needed. Pt/spouse educated in falls prevention strategies and minimizing risk of vertigo or dizziness with ADL tasks modifications. Pt/spouse educated in fine motor coordination exercises for LUE to perform at home with handout provided, verbal instructions, and visual demonstration. Pt/spouse verbalized understanding of all education/training provided. Recommend pt follow up with outpatient OT services should he feel that his LUE coordination/strength does not improve with home exercise program provided in order to maximize return to PLOF. No additional acute OT needs at this time. All additional OT services can be provided on an outpatient basis. Will sign off.     Follow Up Recommendations  Outpatient OT    Equipment Recommendations  None recommended by OT    Recommendations for Other Services       Precautions / Restrictions Precautions Precautions: Fall Restrictions Weight Bearing Restrictions: No      Mobility Bed Mobility Overal bed mobility:  Independent             General bed mobility comments: safe technqiue with upright posture  Transfers Overall transfer level: Independent Equipment used: None                Balance Overall balance assessment: (leaning towards L slightly due to weakness)                                         ADL either performed or assessed with clinical judgement   ADL Overall ADL's : Needs assistance/impaired                                       General ADL Comments: Pt grossly at supervision level for ADL tasks, pt/spouse educated in falls prevention strategies and minimizing risk of vertigo or dizziness with ADL tasks modifications. Pt/spouse verbalized understanding.     Vision Baseline Vision/History: Wears glasses Wears Glasses: Reading only Patient Visual Report: No change from baseline Vision Assessment?: Yes Ocular Range of Motion: Within Functional Limits Alignment/Gaze Preference: Within Defined Limits Tracking/Visual Pursuits: Decreased smoothness of horizontal tracking;Decreased smoothness of vertical tracking Convergence: Within functional limits Visual Fields: No apparent deficits     Perception     Praxis      Pertinent Vitals/Pain Pain Assessment: No/denies pain     Hand Dominance Right   Extremity/Trunk Assessment Upper Extremity Assessment Upper Extremity Assessment: LUE deficits/detail LUE Deficits / Details: grossly 4+/5, 4/5 grip, impaired fine motor coordination with finger to nose, thumb opposition, and RAM testing,  intact sensation LUE Coordination: decreased fine motor   Lower Extremity Assessment Lower Extremity Assessment: LLE deficits/detail LLE Deficits / Details: grossly 4/5 LLE Sensation: (equal bilaterally) LLE Coordination: decreased gross motor   Cervical / Trunk Assessment Cervical / Trunk Assessment: Normal   Communication Communication Communication: No difficulties   Cognition  Arousal/Alertness: Awake/alert Behavior During Therapy: Flat affect Overall Cognitive Status: Within Functional Limits for tasks assessed                                     General Comments       Exercises Other Exercises Other Exercises: Pt/spouse educated in fine motor coordination exercises for LUE to perform at home with handout provided, verbal instructions and visual demonstration. Pt/spouse verbalized understanding and pt able to demonstrate understanding.    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        OT Problem List:        OT Treatment/Interventions:      OT Goals(Current goals can be found in the care plan section) Acute Rehab OT Goals Patient Stated Goal: to get stronger OT Goal Formulation: All assessment and education complete, DC therapy  OT Frequency:     Barriers to D/C:            Co-evaluation              AM-PAC PT "6 Clicks" Daily Activity     Outcome Measure Help from another person eating meals?: None Help from another person taking care of personal grooming?: None Help from another person toileting, which includes using toliet, bedpan, or urinal?: A Little Help from another person bathing (including washing, rinsing, drying)?: A Little Help from another person to put on and taking off regular upper body clothing?: None Help from  another person to put on and taking off regular lower body clothing?: A Little 6 Click Score: 21   End of Session    Activity Tolerance: Patient tolerated treatment well Patient left: in bed;with call bell/phone within reach;with bed alarm set;with family/visitor present  OT Visit Diagnosis: Other abnormalities of gait and mobility (R26.89);Hemiplegia and hemiparesis Hemiplegia - Right/Left: Left Hemiplegia - dominant/non-dominant: Non-Dominant Hemiplegia - caused by: Cerebral infarction                Time: 0913-0926 OT Time Calculation (min): 13 min Charges:  OT General Charges $OT Visit: 1 Visit OT Evaluation $OT Eval Low Complexity: 1 Low OT Treatments $Therapeutic Exercise: 8-22 mins G-Codes: OT G-codes **NOT FOR INPATIENT CLASS** Functional Assessment Tool Used: AM-PAC 6 Clicks Daily Activity;Clinical judgement Functional Limitation: Self care Self Care Current Status (V4098): At least 1 percent but less than 20 percent impaired, limited or restricted Self Care Goal Status (J1914): At least 1 percent but less than 20 percent impaired, limited or restricted Self Care Discharge Status (  [id number]): At least 1 percent but less than 20 percent impaired, limited or restricted   Richrd PrimeJamie Stiller, MPH, MS, OTR/L ascom [phone] 09/02/17, 11:01 AM

## 2017-09-03 LAB — HEMOGLOBIN A1C
HEMOGLOBIN A1C: 7.5 % — AB (ref 4.8–5.6)
Mean Plasma Glucose: 169 mg/dL

## 2017-09-06 ENCOUNTER — Ambulatory Visit: Payer: BLUE CROSS/BLUE SHIELD | Admitting: Family Medicine

## 2017-09-06 ENCOUNTER — Encounter: Payer: Self-pay | Admitting: Family Medicine

## 2017-09-06 VITALS — BP 128/72 | HR 53 | Temp 98.7°F | Resp 16 | Ht 70.0 in | Wt 221.6 lb

## 2017-09-06 DIAGNOSIS — I2510 Atherosclerotic heart disease of native coronary artery without angina pectoris: Secondary | ICD-10-CM

## 2017-09-06 DIAGNOSIS — Z7689 Persons encountering health services in other specified circumstances: Secondary | ICD-10-CM

## 2017-09-06 DIAGNOSIS — M25562 Pain in left knee: Secondary | ICD-10-CM

## 2017-09-06 DIAGNOSIS — I252 Old myocardial infarction: Secondary | ICD-10-CM | POA: Diagnosis not present

## 2017-09-06 DIAGNOSIS — I6930 Unspecified sequelae of cerebral infarction: Secondary | ICD-10-CM

## 2017-09-06 DIAGNOSIS — G8929 Other chronic pain: Secondary | ICD-10-CM

## 2017-09-06 DIAGNOSIS — I10 Essential (primary) hypertension: Secondary | ICD-10-CM | POA: Diagnosis not present

## 2017-09-06 DIAGNOSIS — E782 Mixed hyperlipidemia: Secondary | ICD-10-CM | POA: Diagnosis not present

## 2017-09-06 DIAGNOSIS — M170 Bilateral primary osteoarthritis of knee: Secondary | ICD-10-CM

## 2017-09-06 MED ORDER — DICLOFENAC SODIUM 1 % TD GEL: 2.0000 g | Freq: Three times a day (TID) | TRANSDERMAL | 2 refills | Status: DC | PRN

## 2017-09-06 MED ORDER — CYCLOBENZAPRINE HCL 10 MG PO TABS: 10.0000 mg | ORAL_TABLET | Freq: Three times a day (TID) | ORAL | 2 refills | Status: DC | PRN

## 2017-09-06 NOTE — Patient Instructions (Addendum)
Thank you for coming to the clinic today.  1.  Keep up the good work with home exercises, if strength is not improving or cognition not improving, notify office we can arrange physical therapy or speech therapy if need  2. INCREASE Lisinopril from 20 to 30mg  - ONCE daily - take ONE AND HALF of your 20mg  pills - after 1-2 weeks, before refill, notify office with BP readings, updates, and if want to continue on Lisinopril 30mg  OR YOU MAY DOUBLE FOR 2 pills = 40mg  if ELEVATED BP still or interested to switch to Losartan, less risk of cough and can be dosed for stronger effect  Continue Atenolol 25mg  daily - due to lower heart rate we cannot increase this  We may need 3rd med in future if not controlled  Goal now < 140/90, after 2-4 weeks later, then for sure goal is < 135/85  3.  Continue Plavix 75mg  daily and Aspirin 81mg  daily for now - at least 3 weeks to 3 months after, then we can decide most likely ONLY Plavix 75mg  daily  For Knee Pain  Start Cyclobenzapine (Flexeril) 10mg  tablets (muscle relaxant) - start with half (cut) to one whole pill at night for muscle relaxant - may make you sedated or sleepy (be careful driving or working on this) if tolerated you can take half to whole tab 2 to 3 times daily or every 8 hours as needed  Can try CoQ 10 over the counter, supplement with Lipitor 40 - at future visit we can switch this to Crestor if need due to aches.  Also sent Diclofenac topical gel for knee - may need to get approved first  Please schedule a Follow-up Appointment to: Return in about 4 weeks (around 10/04/2017) for blood pressure, knee pain, follow-up CVA refills.  If you have any other questions or concerns, please feel free to call the clinic or send a message through MyChart. You may also schedule an earlier appointment if necessary.  Additionally, you may be receiving a survey about your experience at our clinic within a few days to 1 week by e-mail or mail. We value your  feedback.  Saralyn PilarAlexander Janyth Riera, DO [facility]

## 2017-09-06 NOTE — Progress Notes (Signed)
Subjective:    Patient ID: [name] [name], male    DOB: [birth year], 56 y.o.   MRN: [id number]  [name] [name] is a 56 y.o. male presenting on 09/06/2017 for Establish Care (hypertension)  Here to establish care, previous doctor in [place] has retired. He is moving to town soon with wife, Tamela OddiBetsy, she accompanies him today and provides additional history.  He was referred to our office by [facility]  HPI   HOSPITAL FOLLOW-UP VISIT  Hospital/Location: [facility] Date of Admission: 08/31/17 Date of Discharge: 09/02/17 Transitions of care telephone call: Not completed, patient not established until now.  Reason for Admission: Acute weakness, Stroke Primary (+Secondary) Diagnosis: Acute CVA, HTN  FOLLOW-UP - Hospital H&P and Discharge Summary have been reviewed - Patient presents today about 4 days after recent hospitalization. Brief summary of recent course, several months ago patient stopped taking all meds (aspirin, blood pressure meds, and cholesterol med), then recently patient had symptoms of acute left sided arm/leg weakness on 08/30/17, did not improve with time, and following day 12/15 presented to ED also with associated L facial droop and some word finding difficulties, in ED  Had NIH stroke scale 4, HTN, Head CT did not show any acute findings, Neurology consulted Dr Thad Rangereynolds and patient admitted for MRI. During hospitalization, had MRI confirm acute R pontine stroke and had ECHO negative. He was treated with DAPT ASA 81 and Plavix, and resumed on high intensity statin, PT/OT initiated but he declined outpatient continuation. He was restarted on anti-HTN therapy without change. - Today reports overall has done well after discharge. Symptoms of L sided weakness and word finding speech have dramatically improved, notably improved each day, not quite back to baseline but much closer. Again not interested in PT/OT outpatient - New medications on discharge: Plavix 75mg , Atenolol 25mg  daily,  Lisinopril 20mg , Aspirin 81mg , Lipitor 40mg  daily - Changes to current meds on discharge: (restarted Atenolol, Lisinopril, ASA) - He does not have Neurology outpatient follow-up  Additional history:  CAD s/p PCI stent (RCA) - Background history with known MI with CAD s/p stent into RCA year 2000, he was initially on Plavix for several weeks after stent then only on Aspirin. Previously seen by [facility] in [place], now last visit Cardiology 12/2014 Dr Kirke CorinArida. It appears stress test was ordered in 2016 but never completed due to patient re-scheduling  Chronic Left Knee Pain, history of OA/DJD - Reports chronic problem L knee pain > R, describes wear and tear no recent injury, prior orthopedics and x-rays he never pursued MRI - He cannot take NSAIDs. Takes some Tylenol at high dose with mild improvement - States knee does not limit daily function but keeps him awake at night due to pain  CHRONIC HTN: Reports concern with home readings brought today initially after hospital SBP 150-160, since improved now 130-140s on avg, DBP 70-80s some high 80s and 93 recently Current Meds - Atenolol 25mg  daily, Lisinopril 20mg  daily   Reports good compliance, took meds today. Tolerating well, w/o complaints.  HYPERLIPIDEMIA: - Reports concerns. Last lipid panel 08/2017, abnormal lipids HDL low, elevated LDL, TG >782>467 - Currently taking Lipitor 40mg  daily now after hospitalization, has some mild left knee aching and pain, no other myalgias currently. Prior history on Lipitor and Crestor in past had to stop due to Myalgias, he tolerated crestor well, he stopped due to non adherence in past  Additionally - Chronic Sinus drainage tried PPI, Flonase  I have reviewed the discharge medication list, and have  reconciled the current and discharge medications today.  Health Maintenance: UTD Flu Shot 09/01/17, from hospital UTD TDap  Depression screen [facility] 2/9 09/06/2017  Decreased Interest 0  Down, Depressed, Hopeless 0   PHQ - 2 Score 0    Past Medical History:  Diagnosis Date  . Anxiety   . CAD (coronary artery disease)    s/p stenting  . GERD (gastroesophageal reflux disease)   . HLD (hyperlipidemia)   . MI (myocardial infarction) (HCC) 2000   Past Surgical History:  Procedure Laterality Date  . CARDIAC CATHETERIZATION     [facility]  . CORONARY STENT PLACEMENT  9/00   Social History   Socioeconomic History  . Marital status: Married    Spouse name: [name]  . Number of children: 0  . Years of education: Not on file  . Highest education level: Not on file  Social Needs  . Financial resource strain: Not hard at all  . Food insecurity - worry: Not on file  . Food insecurity - inability: Not on file  . Transportation needs - medical: Not on file  . Transportation needs - non-medical: Not on file  Occupational History  . 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 Smoking status: Former Smoker    Last attempt to quit: 05/19/1999    Years since quitting: 18.3  . Smokeless tobacco: Former Engineer, water and Sexual Activity  . Alcohol use: Yes    Alcohol/week: 25.0 oz    Types: 50 Standard drinks or equivalent per week    Comment: hard liquor 1-2 times/week  . Drug use: No    Comment: last in 1980's  . Sexual activity: Yes  Other Topics Concern  . Not on file  Social History Narrative   Active and independent at baseline   Family History  Problem Relation Age of Onset  . Hypertension Mother   . Hyperlipidemia Mother   . Arthritis Mother   . Diabetes Mother   . Hypertension Father   . Hyperlipidemia Father   . Alcohol abuse Father   . Diabetes Father   . Colitis Unknown   . Heart disease Unknown    Current Outpatient Medications on File Prior to Visit  Medication Sig  . aspirin EC 81 MG tablet Take 81 mg by mouth daily.  Marland Kitchen atenolol (TENORMIN) 25 MG tablet Take 1 tablet (25 mg total) by mouth daily.  Marland Kitchen atorvastatin (LIPITOR) 40  MG tablet Take 1 tablet (40 mg total) by mouth daily at 6 PM.  . clopidogrel (PLAVIX) 75 MG tablet Take 1 tablet (75 mg total) by mouth daily.  Marland Kitchen lisinopril (PRINIVIL,ZESTRIL) 20 MG tablet Take 1 tablet (20 mg total) by mouth daily.   No current facility-administered medications on file prior to visit.     Review of Systems  Constitutional: Negative for activity change, appetite change, chills, diaphoresis, fatigue and fever.  HENT: Negative for congestion, hearing loss and sinus pressure.   Eyes: Negative for visual disturbance.  Respiratory: Negative for apnea, cough, chest tightness, shortness of breath and wheezing.   Cardiovascular: Negative for chest pain, palpitations and leg swelling.  Gastrointestinal: Negative for abdominal pain, anal bleeding, blood in stool, constipation, diarrhea, nausea and vomiting.  Endocrine: Negative for cold intolerance.  Genitourinary: Negative for decreased urine volume, difficulty urinating, dysuria, frequency, hematuria, testicular pain and urgency.  Musculoskeletal: Positive for arthralgias (L knee). Negative for back pain and neck pain.  Skin: Negative  for rash.  Allergic/Immunologic: Negative for environmental allergies.  Neurological: Positive for weakness (resolving L side arm/leg). Negative for dizziness, light-headedness, numbness and headaches.  Hematological: Negative for adenopathy.  Psychiatric/Behavioral: Negative for behavioral problems, dysphoric mood and sleep disturbance. The patient is not nervous/anxious.    Per HPI unless specifically indicated above     Objective:    BP 128/72 (BP Location: Left Arm, Cuff Size: Normal)   Pulse (!) 53   Temp 98.7 F (37.1 C) (Oral)   Resp 16   Ht 5\' 10"  (1.778 m)   Wt 221 lb 9.6 oz (100.5 kg)   BMI 31.80 kg/m   Wt Readings from Last 3 Encounters:  09/06/17 221 lb 9.6 oz (100.5 kg)  08/31/17 226 lb 14.4 oz (102.9 kg)  12/09/14 (!) 323 lb (146.5 kg)    Physical Exam  Constitutional:  He is oriented to person, place, and time. He appears well-developed and well-nourished. No distress.  Well-appearing, comfortable, cooperative  HENT:  Head: Normocephalic and atraumatic.  Mouth/Throat: Oropharynx is clear and moist.  Eyes: Conjunctivae are normal. Right eye exhibits no discharge. Left eye exhibits no discharge.  Neck: Normal range of motion. Neck supple.  Cardiovascular: Normal rate, regular rhythm, normal heart sounds and intact distal pulses.  No murmur heard. Pulmonary/Chest: Effort normal and breath sounds normal. No respiratory distress. He has no wheezes. He has no rales.  Musculoskeletal: Normal range of motion. He exhibits no edema.  Upper / Lower Extremities: - Normal muscle tone, strength bilateral upper extremities 5/5, lower extremities 5/5  - Normal Gait  Left Knee Inspection: Normal appearance and symmetrical. No ecchymosis or effusion. Palpation: Non-tender. Mild crepitus ROM: Full active ROM bilaterally Strength: 5/5 intact knee flex/ext, ankle dorsi/plantarflex Neurovascular: distally intact sensation light touch and pulses  Lymphadenopathy:    He has no cervical adenopathy.  Neurological: He is alert and oriented to person, place, and time.  Skin: Skin is warm and dry. No rash noted. He is not diaphoretic. No erythema.  Psychiatric: He has a normal mood and affect. His behavior is normal.  Well groomed, good eye contact, normal speech and thoughts  Nursing note and vitals reviewed.    I have personally reviewed the radiology reports from [facility] from [facility].  CLINICAL DATA:  Left-sided weakness. Left facial droop. Slurred speech.  EXAM: MRI HEAD WITHOUT CONTRAST  MRA HEAD WITHOUT CONTRAST  MRA NECK WITHOUT CONTRAST  TECHNIQUE: Multiplanar, multiecho pulse sequences of the brain and surrounding structures were obtained without intravenous contrast. Angiographic images of the Circle of Willis were obtained using MRA  technique without intravenous contrast. Angiographic images of the neck were obtained using MRA technique without intravenous contrast. Carotid stenosis measurements (when applicable) are obtained utilizing NASCET criteria, using the distal internal carotid diameter as the denominator.  COMPARISON:  Head CT 08/31/2017  FINDINGS: MRI HEAD FINDINGS  Brain: There is an acute right paramedian pontine infarct measuring 23 x 8 mm. No intracranial hemorrhage, mass, midline shift, or extra-axial fluid collection is identified. The ventricles and sulci are normal. There is no significant cerebral white matter disease.  Vascular: Major intracranial vascular flow voids are preserved.  Skull and upper cervical spine: Unremarkable bone marrow signal.  Sinuses/Orbits: Unremarkable orbits. Paranasal sinuses and mastoid air cells are clear.  Other: None.  MRA HEAD FINDINGS  The visualized distal vertebral arteries are widely patent to the basilar. PICA origins were not imaged. Patent right AICA and bilateral SCA origins are identified. The basilar artery is  widely patent. The PCAs are patent without evidence of significant stenosis.  The internal carotid arteries are widely patent from skullbase to carotid termini. ACAs and MCAs are patent without evidence of proximal branch occlusion or significant proximal stenosis. There is fullness in the anterior communicating artery region. This may be secondary to the convergence of multiple mildly tortuous vessels in this location combined with mild artifact as a discrete aneurysm is not clearly demonstrated but is not excluded. 1.5-2 mm outpouchings are noted extending posteriorly from the right ICA terminus (series 7, image 81) and proximal left A2 segment (series 7, image 92).  MRA NECK FINDINGS  There is a standard 3 vessel aortic arch. Noncontrast technique limits assessment, with poor flow related enhancement in  the brachiocephalic, subclavian, and proximal common carotid arteries. The mid and distal common carotid arteries are widely patent bilaterally. The cervical internal carotid arteries are also patent without evidence of significant stenosis.  The vertebral arteries are patent with antegrade flow bilaterally. The V1 segments are not well evaluated, although the V2 segments are widely patent without evidence of stenosis or dissection. The proximal V3 segments are widely patent, while signal loss in the distal V3 and V4 segments is likely technical in nature.  IMPRESSION: 1. Acute right pontine infarct. 2. Patent circle of Willis without evidence of significant stenosis. 3. 1.5-2 mm outpouchings from the right ICA terminus and left A2 segment. These may represent tiny aneurysms versus artifact or origins of poorly resolved vessels. There is also prominence of the anterior communicating region without a discrete aneurysm. Nonemergent CTA is recommended for further evaluation. 4. No evidence of significant cervical carotid or vertebral artery stenosis, although the proximal vessels are poorly evaluated due to noncontrast technique.   Electronically Signed   By: Sebastian Ache M.D.   On: 08/31/2017 15:48  ------------------  Result status: Final result                   [facility]*                       [address]                        [place], [place] [address]                            [phone]  ------------------------------------------------------------------- Transthoracic Echocardiography  Patient:    Tomer, Chalmers MR #:       [id number] Study Date: 09/01/2017 Gender:     M Age:        56 Height:     177.8 cm Weight:     102.9 kg BSA:        2.29 m^2 Pt. Status: Room:   PERFORMING   Adrian Blackwater, MD  SONOGRAPHER  [facility]CS  ATTENDING    Enid Baas  Kenna Gilbert, Radhika  REFERRING     [place], [place]  cc:  ------------------------------------------------------------------- LV EF: 50%  ------------------------------------------------------------------- Indications:      Stroke 434.91.  ------------------------------------------------------------------- Study Conclusions  - Left ventricle: The cavity size was normal. Systolic function was   normal. The estimated ejection fraction was 50%. Doppler   parameters are consistent with abnormal left ventricular   relaxation (grade 1 diastolic dysfunction). - Left atrium: The atrium was mildly dilated. - Atrial septum: No defect or patent foramen ovale  was identified.  Impressions:  - borderline left ventricular systolic function with normal wall   motion and no evidence of thrombi in left atrium or left   ventricle and bubble study was negative for any shunt.  ------------------------------------------------------------------- Study data:   Study status:  Routine.  Procedure:  Transthoracic echocardiography. Image quality was fair. Intravenous contrast (agitated saline) was administered.          Transthoracic echocardiography.  M-mode, complete 2D, spectral Doppler, and color Doppler.  Birthdate:  Patient birthdate: 1960-10-21.  Age:  Patient is 56 yr old.  Sex:  Gender: male.    BMI: 32.6 kg/m^2.  Blood pressure:     136/93  Patient status:  Inpatient.  Study date: Study date: 09/01/2017. Study time: 10:47 AM.  -------------------------------------------------------------------  ------------------------------------------------------------------- Left ventricle:  The cavity size was normal. Systolic function was normal. The estimated ejection fraction was 50%. Doppler parameters are consistent with abnormal left ventricular relaxation (grade 1 diastolic dysfunction).  ------------------------------------------------------------------- Aortic valve:  Sclerosis without stenosis.  Doppler:  There  was no regurgitation.  ------------------------------------------------------------------- Mitral valve:   Doppler:  There was trivial regurgitation.  ------------------------------------------------------------------- Left atrium:  The atrium was mildly dilated.  ------------------------------------------------------------------- Atrial septum:  No defect or patent foramen ovale was identified.   ------------------------------------------------------------------- Right ventricle:  The cavity size was normal. Wall thickness was normal. Systolic function was normal.  ------------------------------------------------------------------- Pulmonic valve:    Doppler:  There was trivial regurgitation.  ------------------------------------------------------------------- Tricuspid valve:   Doppler:  There was trivial regurgitation.   ------------------------------------------------------------------- Right atrium:  The atrium was normal in size.     08/31/17 - MRI Brain/Neck MRA confirmed R sided acute pontine infarct (without evidence of arterial stenosis in carotids)  09/01/17 - ECHO 09/01/17 bubble study, mostly unremarkable without clot or shunt or other abnormality to cause CVA   Results for orders placed or performed during the hospital encounter of 08/31/17  Protime-INR  Result Value Ref Range   Prothrombin Time 12.5 11.4 - 15.2 seconds   INR 0.94   APTT  Result Value Ref Range   aPTT 27 24 - 36 seconds  CBC  Result Value Ref Range   WBC 8.0 3.8 - 10.6 K/uL   RBC 5.88 4.40 - 5.90 MIL/uL   Hemoglobin 16.9 13.0 - 18.0 g/dL   HCT 16.1 09.6 - 04.5 %   MCV 84.3 80.0 - 100.0 fL   MCH 28.8 26.0 - 34.0 pg   MCHC 34.1 32.0 - 36.0 g/dL   RDW 40.9 81.1 - 91.4 %   Platelets 252 150 - 440 K/uL  Differential  Result Value Ref Range   Neutrophils Relative % 70 %   Neutro Abs 5.7 1.4 - 6.5 K/uL   Lymphocytes Relative 21 %   Lymphs Abs 1.7 1.0 - 3.6 K/uL   Monocytes  Relative 7 %   Monocytes Absolute 0.5 0.2 - 1.0 K/uL   Eosinophils Relative 1 %   Eosinophils Absolute 0.1 0 - 0.7 K/uL   Basophils Relative 1 %   Basophils Absolute 0.1 0 - 0.1 K/uL  Comprehensive metabolic panel  Result Value Ref Range   Sodium 136 135 - 145 mmol/L   Potassium 4.2 3.5 - 5.1 mmol/L   Chloride 104 101 - 111 mmol/L   CO2 25 22 - 32 mmol/L   Glucose, Bld 204 (H) 65 - 99 mg/dL   BUN 12 6 - 20 mg/dL   Creatinine, Ser 7.82 0.61 - 1.24 mg/dL   Calcium 8.9 8.9 -  10.3 mg/dL   Total Protein 6.7 6.5 - 8.1 g/dL   Albumin 3.9 3.5 - 5.0 g/dL   AST 31 15 - 41 U/L   ALT 34 17 - 63 U/L   Alkaline Phosphatase 87 38 - 126 U/L   Total Bilirubin 0.8 0.3 - 1.2 mg/dL   GFR calc non Af Amer >60 >60 mL/min   GFR calc Af Amer >60 >60 mL/min   Anion gap 7 5 - 15  Troponin I  Result Value Ref Range   Troponin I <0.03 <0.03 ng/mL  HIV antibody (Routine Testing)  Result Value Ref Range   HIV Screen 4th Generation wRfx Non Reactive Non Reactive  TSH  Result Value Ref Range   TSH 1.569 0.350 - 4.500 uIU/mL  Troponin I  Result Value Ref Range   Troponin I <0.03 <0.03 ng/mL  Troponin I  Result Value Ref Range   Troponin I <0.03 <0.03 ng/mL  Troponin I  Result Value Ref Range   Troponin I <0.03 <0.03 ng/mL  Hemoglobin A1c  Result Value Ref Range   Hgb A1c MFr Bld 7.5 (H) 4.8 - 5.6 %   Mean Plasma Glucose 169 mg/dL  Lipid panel  Result Value Ref Range   Cholesterol 277 (H) 0 - 200 mg/dL   Triglycerides 161 (H) <150 mg/dL   HDL 27 (L) >09 mg/dL   Total CHOL/HDL Ratio 10.3 RATIO   VLDL UNABLE TO CALCULATE IF TRIGLYCERIDE OVER 400 mg/dL 0 - 40 mg/dL   LDL Cholesterol UNABLE TO CALCULATE IF TRIGLYCERIDE OVER 400 mg/dL 0 - 99 mg/dL  Basic metabolic panel  Result Value Ref Range   Sodium 137 135 - 145 mmol/L   Potassium 3.7 3.5 - 5.1 mmol/L   Chloride 104 101 - 111 mmol/L   CO2 24 22 - 32 mmol/L   Glucose, Bld 149 (H) 65 - 99 mg/dL   BUN 14 6 - 20 mg/dL   Creatinine, Ser 6.04  0.61 - 1.24 mg/dL   Calcium 9.0 8.9 - 54.0 mg/dL   GFR calc non Af Amer >60 >60 mL/min   GFR calc Af Amer >60 >60 mL/min   Anion gap 9 5 - 15  CBC  Result Value Ref Range   WBC 8.6 3.8 - 10.6 K/uL   RBC 5.73 4.40 - 5.90 MIL/uL   Hemoglobin 16.6 13.0 - 18.0 g/dL   HCT 98.1 19.1 - 47.8 %   MCV 84.3 80.0 - 100.0 fL   MCH 29.0 26.0 - 34.0 pg   MCHC 34.4 32.0 - 36.0 g/dL   RDW 29.5 62.1 - 30.8 %   Platelets 239 150 - 440 K/uL      Assessment & Plan:   Problem List Items Addressed This Visit    CAD (coronary artery disease)    Stable without angina S/p MI and RCA Stent year 2000, initial plavix then off only on ASA but non adherent Previously [facility], has seen Dr Kirke Corin 2016  Plan: 1. Continue DAPT - ASA 81 and Plavix 75mg  daily - will likely continue plavix 75 monotherapy indefinitely 2. Continue med management ACEi, BB, Statin 3. Recommend future referral to return to [facility] Cardiology now moving locally for further regular monitoring      Relevant Medications   aspirin EC 81 MG tablet   Chronic pain of left knee    Stable chronic problem Pain limiting his sleep No new injury Prior imaging x-rays not available, prior ortho  Plan Recommend to avoid  NSAID s/p CVA and with CAD. Continue Tylenol PRN Start Flexeril 5-10mg  nightly PRN may help sleep and avoid provoked pain overnight Additionally new rx Diclofenac topical gel for topical NSAID pain relief In future may consider X-rays update, possibly referral back to ortho      Relevant Medications   aspirin EC 81 MG tablet   cyclobenzaprine (FLEXERIL) 10 MG tablet   diclofenac sodium (VOLTAREN) 1 % GEL   Essential hypertension    Improving HTN control back on meds. Now 1 week out of CVA, previously was okay with elevated BP for permissive HTN, now goal to control < 140/90 Mildly elevated initial BP, repeat manual check and repeat electronic cuff. - Home BP readings mixed results, improving  Complicated  with history of CAD, s/p MI, CVA   Plan:  1. INCREASE Lisinopril from 20 to 30mg  - take ONE and HALF tablet for now 1-2 weeks, monitor BP at home, report to [place] by phone 1-2 weeks if improved and need new rx or consider double dose for 40mg  daily if need, rather than add 3rd agent at this time. Cannot inc BB due to HR 50-60s 2. Encourage improved lifestyle - low sodium diet, regular exercise 3. Continue monitor BP outside office, bring readings to next visit, if persistently >140/90 or new symptoms notify office sooner 4. Follow-up 4 weeks for HTN, med adjust - also consider switch Lisinopril to Losartan perhaps for better potency and avoid possible ACEi cough with his chronic sinus drainage and cough      Relevant Medications   aspirin EC 81 MG tablet   History of cerebrovascular accident (CVA) with residual deficit - Primary    Improving residual deficits L sided weakness and word finding/speech, now nearly back to baseline S/p R acute pontine CVA with hospitalization 12/15 to 12/17 WIthout outpatient PT/OT patient declined Not followed by Neurology Not consisted ASA failure since was only intermittently on this prior to stroke  Plan: 1. Continue DAPT ASA 81 and Plavix 75mg  daily for at least 3 weeks - then likely switch to Plavix 75mg  monotherapy, otherwise concern with known CAD MI and stent, could benefit from lifelong DAPT - will reconsider in future 2. Continue working on home rehab exercises 3. Control BP - see A&P 4. Follow-up sooner if any concerns or worsening, strict return to ED criteria, discussed risk of recurrence stroke, future may refer to Neurology if other concerns post CVA      History of MI (myocardial infarction)   Hyperlipidemia    Uncontrolled cholesterol previously non adherent to statin / intolerance, and poor lifestyle Last lipid panel 08/2017 Known ASCVD risk CAD, CVA  Plan: 1. Continue current meds - Lipitor 40mg  daily for now - concern some myalgias,  recommend to ADD CoQ10 trial OTC - if not improve we can switch back to Crestor to see if helps, otherwise may have to consider Livalo vs PCSK9 inhibitor 2. Continue ASA 81mg  + Plavix 75mg  for secondary ASCVD risk reduction 3. Encourage improved lifestyle - low carb/cholesterol, reduce portion size, continue improving regular exercise 4. Re-check fasting lipids within 3-6 months now back on statin      Relevant Medications   aspirin EC 81 MG tablet    Other Visit Diagnoses    Encounter to establish care with new doctor       Primary osteoarthritis of both knees       Relevant Medications   aspirin EC 81 MG tablet   cyclobenzaprine (FLEXERIL) 10 MG tablet  diclofenac sodium (VOLTAREN) 1 % GEL      Meds ordered this encounter  Medications  . cyclobenzaprine (FLEXERIL) 10 MG tablet    Sig: Take 1 tablet (10 mg total) by mouth 3 (three) times daily as needed for muscle spasms. May take at evening only if preferred    Dispense:  30 tablet    Refill:  2  . diclofenac sodium (VOLTAREN) 1 % GEL    Sig: Apply 2 g topically 3 (three) times daily as needed (knee pain).    Dispense:  100 g    Refill:  2      Follow up plan: Return in about 4 weeks (around 10/04/2017) for blood pressure, knee pain, follow-up CVA refills.  A total of >45 minutes was spent face-to-face with this patient. Greater than 50% of this time was spent in counseling on CVA symptoms, residual deficits, complications, related factors with HTN, HLD, medication management treatment, prevention.  Saralyn PilarAlexander Gregg Holster, DO [facility] 09/06/2017, 1:34 PM

## 2017-09-06 NOTE — Assessment & Plan Note (Signed)
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 Continue DAPT ASA 81 and Plavix 75mg  daily for at least 3 weeks - then likely switch to Plavix 75mg  monotherapy, otherwise concern with known CAD MI and stent, could benefit from lifelong DAPT - will reconsider in future 2. Continue working on home rehab exercises 3. Control BP - see A&P 4. Follow-up sooner if any concerns or worsening, strict return to ED criteria, discussed risk of recurrence stroke, future may refer to Neurology if other concerns post CVA

## 2017-09-06 NOTE — Assessment & Plan Note (Signed)
Stable chronic problem Pain limiting his sleep No new injury Prior imaging x-rays not available, prior ortho  Plan Recommend to avoid NSAID s/p CVA and with CAD. Continue Tylenol PRN Start Flexeril 5-10mg  nightly PRN may help sleep and avoid provoked pain overnight Additionally new rx Diclofenac topical gel for topical NSAID pain relief In future may consider X-rays update, possibly referral back to ortho

## 2017-09-06 NOTE — Assessment & Plan Note (Addendum)
Uncontrolled cholesterol previously non adherent to statin / intolerance, and poor lifestyle Last lipid panel 08/2017 Known ASCVD risk CAD, CVA  Plan: 1. Continue current meds - Lipitor 40mg  daily for now - concern some myalgias, recommend to ADD CoQ10 trial OTC - if not improve we can switch back to Crestor to see if helps, otherwise may have to consider Livalo vs PCSK9 inhibitor 2. Continue ASA 81mg  + Plavix 75mg  for secondary ASCVD risk reduction 3. Encourage improved lifestyle - low carb/cholesterol, reduce portion size, continue improving regular exercise 4. Re-check fasting lipids within 3-6 months now back on statin

## 2017-09-06 NOTE — Assessment & Plan Note (Signed)
Stable without angina S/p MI and RCA Stent year 2000, initial plavix then off only on ASA but non adherent [facility] has seen Dr Kirke CorinArida 2016  Plan: 1. Continue DAPT - ASA 81 and Plavix 75mg  daily - will likely continue plavix 75 monotherapy indefinitely 2. Continue med management ACEi, BB, Statin 3. Recommend future [facility] now moving locally for further regular monitoring

## 2017-09-06 NOTE — Assessment & Plan Note (Signed)
Improving HTN control back on meds. Now 1 week out of CVA, previously was okay with elevated BP for permissive HTN, now goal to control < 140/90 Mildly elevated initial BP, repeat manual check and repeat electronic cuff. - Home BP readings mixed results, improving  Complicated with history of CAD, s/p MI, CVA   Plan:  1. INCREASE Lisinopril from 20 to 30mg  - take ONE and HALF tablet for now 1-2 weeks, monitor BP at home, report to us by phone 1-2 weeks if improved and need new rx or consider double dose for 40mg  daily if need, rather than add 3rd agent at this time. Cannot inc BB due to HR 50-60s 2. Encourage improved lifestyle - low sodium diet, regular exercise 3. Continue monitor BP outside office, bring readings to next visit, if persistently >140/90 or new symptoms notify office sooner 4. Follow-up 4 weeks for HTN, med adjust - also consider switch Lisinopril to Losartan perhaps for better potency and avoid possible ACEi cough with his chronic sinus drainage and cough

## 2017-09-16 ENCOUNTER — Ambulatory Visit: Payer: Self-pay | Admitting: Family Medicine

## 2017-09-20 ENCOUNTER — Telehealth: Payer: Self-pay | Admitting: Family Medicine

## 2017-09-20 DIAGNOSIS — I10 Essential (primary) hypertension: Secondary | ICD-10-CM

## 2017-09-20 MED ORDER — LOSARTAN POTASSIUM 50 MG PO TABS: 50.0000 mg | ORAL_TABLET | Freq: Every day | ORAL | 2 refills | Status: DC

## 2017-09-20 NOTE — Telephone Encounter (Signed)
Last visit 09/06/17, see note. Now patient dropped off 1 week of BP readings on increased Lisinopril 30mg  daily (1.5 tabs, 20mg ) and continued Atenolol 25mg  daily. Readings still elevated avg SBP >140, DBP recently in 90s or >85 on avg.  Called patient. Agree to discontinue Lisinopril and switch to Losartan start 50mg  daily. New rx sent. Next option in future will be to titrate up to 100mg  daily if needed. Continue Atenolol.  Regarding Diclofenac, [facility] denial [facility] since stated that patient was taking NSAID, otherwise would be approved. Asked patient he has taken occasional ibuprofen before but he is not on any rx NSAID, and I advised him oral NSAID is not advised should be used with caution, prefer Tylenol only. Will call # on form for Physician Appeal consult [facility] [phone].  Saralyn PilarAlexander Karamalegos, DO [facility] 09/20/2017, 5:22 PM

## 2017-10-04 ENCOUNTER — Ambulatory Visit: Payer: BLUE CROSS/BLUE SHIELD | Admitting: Family Medicine

## 2017-10-04 ENCOUNTER — Encounter: Payer: Self-pay | Admitting: Family Medicine

## 2017-10-04 VITALS — BP 136/78 | HR 67 | Temp 98.5°F | Resp 16 | Ht 70.0 in | Wt 221.0 lb

## 2017-10-04 DIAGNOSIS — J329 Chronic sinusitis, unspecified: Secondary | ICD-10-CM | POA: Insufficient documentation

## 2017-10-04 DIAGNOSIS — Z1159 Encounter for screening for other viral diseases: Secondary | ICD-10-CM | POA: Diagnosis not present

## 2017-10-04 DIAGNOSIS — E782 Mixed hyperlipidemia: Secondary | ICD-10-CM

## 2017-10-04 DIAGNOSIS — M25562 Pain in left knee: Secondary | ICD-10-CM

## 2017-10-04 DIAGNOSIS — R7309 Other abnormal glucose: Secondary | ICD-10-CM | POA: Diagnosis not present

## 2017-10-04 DIAGNOSIS — I10 Essential (primary) hypertension: Secondary | ICD-10-CM

## 2017-10-04 DIAGNOSIS — G8929 Other chronic pain: Secondary | ICD-10-CM | POA: Diagnosis not present

## 2017-10-04 DIAGNOSIS — I6930 Unspecified sequelae of cerebral infarction: Secondary | ICD-10-CM

## 2017-10-04 DIAGNOSIS — R5383 Other fatigue: Secondary | ICD-10-CM

## 2017-10-04 DIAGNOSIS — I2510 Atherosclerotic heart disease of native coronary artery without angina pectoris: Secondary | ICD-10-CM

## 2017-10-04 MED ORDER — ATORVASTATIN CALCIUM 40 MG PO TABS: 40.0000 mg | ORAL_TABLET | Freq: Every day | ORAL | 3 refills | Status: DC

## 2017-10-04 MED ORDER — IPRATROPIUM BROMIDE 0.06 % NA SOLN: NASAL | 3 refills | Status: DC

## 2017-10-04 MED ORDER — CLOPIDOGREL BISULFATE 75 MG PO TABS: 75.0000 mg | ORAL_TABLET | Freq: Every day | ORAL | 3 refills | Status: DC

## 2017-10-04 MED ORDER — ATENOLOL 25 MG PO TABS: 25.0000 mg | ORAL_TABLET | Freq: Every day | ORAL | 2 refills | Status: DC

## 2017-10-04 NOTE — Assessment & Plan Note (Signed)
Stable chronic post nasal drainage, without active sinusitis infection Prior history negative work-up GERD, ENT Allergist Failed Flonase years ago  Plan 1. Trial on Atrovent - BID daily use then may use QID PRN flare 2. Discussed Dymista or other combo therapy for anti-histamine nasal and nasal steroid in future 3. May need ENT in future

## 2017-10-04 NOTE — Patient Instructions (Addendum)
Thank you for coming to the office today.  1. Due to low energy - will recommend to REDUCE Atenolol from 25mg  tablet to CUT IN HALF for 12.5mg  daily - to see if energy improves with this change  CONTINUE Losartan 50mg  daily for now I think this is working well.  Check home BP still now about 1-2 hours AFTER meds - make sure still closer to goal < 135/85. It is improved in evening.  If BP not controlled, then would increase Losartan to ONE AND HALF tab for 75mg  daily. May need 2 pills for 100mg  eventually.  2. For knee, go ahead and use topical Diclofenac as prescribed  If you need it.  Recommend to start taking Tylenol Extra Strength 500mg  tabs - take 1 to 2 tabs per dose (max 1000mg ) every 6-8 hours for pain (take regularly, don't skip a dose for next 7 days), max 24 hour daily dose is 6 tablets or 3000mg . In the future you can repeat the same everyday Tylenol course for 1-2 weeks at a time.   3.  For chronic nasal drainge Start Atrovent nasal spray decongestant May need other spray in future Dymista or ENT  Call and schedule with [facility] - Recommend follow-up to discuss current Blood Pressure, Medications, and Heart History, after your stroke  Will check cholesterol, chemistry, Vitamin D  DUE for FASTING BLOOD WORK (no food or drink after midnight before the lab appointment, only water or coffee without cream/sugar on the morning of)  SCHEDULE "Lab Only" visit in the morning at the clinic for lab draw in 3 MONTHS   - Make sure Lab Only appointment is at about 1 week before your next appointment, so that results will be available  For Lab Results, once available within 2-3 days of blood draw, you can can log in to MyChart online to view your results and a brief explanation. Also, we can discuss results at next follow-up visit.  Please schedule a Follow-up Appointment to: Return in about 3 months (around 01/02/2018) for HTN, Low Energy, postCVA, Cardiology.   If  you have any other questions or concerns, please feel free to call the office or send a message through MyChart. You may also schedule an earlier appointment if necessary.  Additionally, you may be receiving a survey about your experience at our office within a few days to 1 week by e-mail or mail. We value your feedback.  [name] PilarAlexander Nalin Mazzocco, DO [facility]

## 2017-10-04 NOTE — Assessment & Plan Note (Signed)
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 Continue DAPT ASA 81 and Plavix 75mg  daily for at least 3 weeks - then likely switch to Plavix 75mg  monotherapy, otherwise concern with known CAD MI and stent, could benefit from lifelong DAPT - will reconsider in future - Return to Cardiology to re-evaluate DAPT and med management 2. Continue working on home rehab exercises 3. Control BP - see A&P 4. Follow-up sooner if any concerns or worsening, strict return to ED criteria, discussed risk of recurrence stroke, future may refer to Neurology if other concerns post CVA

## 2017-10-04 NOTE — Assessment & Plan Note (Signed)
Not due yet for repeat lipids. Uncontrolled cholesterol previously non adherent to statin / intolerance, and poor lifestyle Last lipid panel 08/2017 Known ASCVD risk CAD, CVA  Plan: 1. Continue current meds - Lipitor 40mg  daily - still recommend to ADD CoQ10 trial OTC - if not improve we can switch back to Crestor to see if helps, otherwise may have to consider Livalo vs PCSK9 inhibitor 2. Continue ASA 81mg  + Plavix 75mg  for secondary ASCVD risk reduction 3. Encourage improved lifestyle - low carb/cholesterol, reduce portion size, continue improving regular exercise 4. Re-check fasting lipids within 3 months  F/u Cardiology

## 2017-10-04 NOTE — Assessment & Plan Note (Signed)
Stable without angina S/p MI and RCA Stent year 2000, initial plavix then off only on ASA but non adherent [facility] has seen Dr Kirke CorinArida 2016  Plan: 1. Continue DAPT - ASA 81 and Plavix 75mg  daily - will likely continue plavix 75 monotherapy indefinitely - discussed uncertain duration of ASA 81 - for now I would recommend continue both post CVA - will request discussion with Cardiology next - [facility] Kirke CorinArida review med management including DAPT 2. Continue med management ACEi, BB, Statin - (note reduced BB dose to reduce side effect possibly)

## 2017-10-04 NOTE — Assessment & Plan Note (Addendum)
Still improving HTN control now on ARB off ACEi, goal BP < 135/85 - Home BP readings detailed report BID readings - AM higher avg and PM controlled - note AM readings are BEFORE meds Complicated with history of CAD, s/p MI, CVA    Plan:  1. REDUCE Atenolol from 25mg  daily to HALF tab 12.5mg  DAILY - see if will improve energy and reduce fatigue - Continue current Losartan 50mg  daily for now - if needed may increase Losartan to 75mg  (1.5 tabs), or max dose 100mg  in future - HR in 60s avg, discussed avoiding increasing BB in future 2. Encourage improved lifestyle - low sodium diet, regular exercise 3. Continue monitor BP outside office, bring readings to next visit, if persistently >135/85 or new symptoms notify office sooner 4. Follow-up 3 months for HTN, med adjust - may need switch BB or inc ARB in future  [facility] Kirke Corin[facility]ida review BP meds among other med management, follow-up CAD

## 2017-10-04 NOTE — Assessment & Plan Note (Signed)
No change, Stable chronic problem Pain worse only at night when rest, history suggestive of arthritis, also more muscle fatigue related to CVA, vs possible med side effect ?statin vs BB fatigue No new injury Prior imaging x-rays not available, prior ortho  Plan Start topical diclofenac NSAID for PRN use goal for relief at night and better function less fatigue if pain improved May use Flexeril PRN at night - he has rarely used so far In future may consider X-rays update, possibly referral back to ortho

## 2017-10-04 NOTE — Progress Notes (Signed)
Subjective:    Patient ID: [name] [name], male    DOB: Jan 07, 1961, 57 y.o.   MRN: [id number]  [name] [name] is a 57 y.o. male presenting on 10/04/2017 for Hypertension  Patient provides majority of history. He is accompanied by wife, [name], who provides some as well.  HPI   CHRONIC HTN: - Last visit with me 09/06/17, for initial visit for HTN after hospitalization for CVA, treated with inc Lisinoipril from 20 to 30mg  (1.5 tabs) and more BP monitoring, see prior notes for background information. - Interval update with phone update 09/20/17 see note, had still elevated readings, advised to [facility] from Lisinopril to Losartan 50mg  daily - new rx sent in - Today patient reports BP seems improved overall but still high in morning BEFORE meds. Has log shows BID readings, AM avg is 140-150 / 90s, and PM avg is 120-130/70s Current Meds - Atenolol 25mg  daily, Losartan 50mg  daily Reports good compliance, took meds today. Tolerating well, w/o complaints. 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 We adjusted HTN meds - Today he reports significant improvement, still a gradual process, with some weakness on left side, mostly only subjective now and some "muscle fatigue" and aching at times. He states before could exert self for up to 2 hours and then get tired and now he feels exhausted after about 8 hours, which is improvement - He is not back to full activity yet - He is eating better and feeling more back to normal - Still difficulty with speech and word finding etc worse in evening with fatigue - Admits low energy  CAD s/p PCI  stent (RCA) - Background history with known MI with CAD s/p stent into RCA year 2000, he was initially on Plavix for several weeks after stent then only on Aspirin. Previously seen by [facility] in [place], now last visit Cardiology 12/2014 Dr Kirke Corin. It appears stress test was ordered in 2016 but never completed due to patient re-scheduling - Today reviewed history again, and he continues DAPT with ASA 81 and Plavix 75mg  after CVA - Request refill Plavix - he has not scheduled to see Dr Kirke Corin, but he is interested  HYPERLIPIDEMIA: - Last lipid panel 12/18, elevated abnormal - Currently taking Atorvastatin 40mg  daily again after hospitalization (was on years ago but stopped non adherence) - asking about Fish Oil if needed again, used to be on this  Chronic Left Knee Pain, history of OA/DJD - Last visit with me 09/06/17, for initial visit for same problem, treated with trial rx Diclofenac topical for knees, cannot take NSAIDs due to DAPT and CVA/CAD, see prior notes for background information. - Interval update with completed PA and approval for Diclofenac - Today patient reports he has not started med yet, has picked it up but will start soon, wanted to ask me about it again - Still has primarily L knee pain and stiffness aching only at night really, during day with activity he does well, once active - No new injury or fall or problem - Difficult with CVA L sided weakness gradually improving, wondering how much of that is affecting his knee pain - He cannot take NSAIDs.  Takes some Tylenol at high dose with mild improvement - States knee does not limit daily function but keeps him awake at night due to pain - Not taking Flexeril regularly PRN at night only tried 2 doses  Chronic Post nasal drip / Rhinosinusitis Reports that he has seen Allergist in past, has chronic history of post nasal drainage, no clear diagnosis in past, thought maybe GERD related or other cause. - Tried Flonase before with  limited results, he is willing to try again - No help from allergy meds in past  Health Maintenance: - Colon CA Screening: No prior screening. He is interested in Cologuard - will discuss at next visit  Depression screen [facility] 2/9 10/04/2017 09/06/2017  Decreased Interest 0 0  Down, Depressed, Hopeless 0 0  PHQ - 2 Score 0 0    Social History   Tobacco Use  . Smoking status: Former Smoker    Last attempt to quit: 05/19/1999    Years since quitting: 18.3  . Smokeless tobacco: Former Engineer, water Use Topics  . Alcohol use: Yes    Comment: 8-10 oz, twice weekly  . Drug use: No    Comment: last in 1980's    Review of Systems Per HPI unless specifically indicated above     Objective:    BP 136/78 (BP Location: Left Arm, Cuff Size: Normal)   Pulse 67   Temp 98.5 F (36.9 C) (Oral)   Resp 16   Ht 5\' 10"  (1.778 m)   Wt 221 lb (100.2 kg)   BMI 31.71 kg/m   Wt Readings from Last 3 Encounters:  10/04/17 221 lb (100.2 kg)  09/06/17 221 lb 9.6 oz (100.5 kg)  08/31/17 226 lb 14.4 oz (102.9 kg)    Physical Exam  Constitutional: He is oriented to person, place, and time. He appears well-developed and well-nourished. No distress.  Well-appearing, comfortable, cooperative  HENT:  Head: Normocephalic and atraumatic.  Mouth/Throat: Oropharynx is clear and moist.  Frontal / maxillary sinuses non-tender. Nares patent without congestion or purulence or edema. Oropharynx with mild posterior pharyngeal drainage otherwise clear without erythema, exudates, edema or asymmetry.  Eyes: Conjunctivae are normal. Right eye exhibits no discharge. Left eye exhibits no discharge.  Neck: Normal range of motion. Neck supple. No thyromegaly present.  Cardiovascular: Normal rate, regular rhythm, normal heart sounds and intact distal pulses.  No murmur heard. Pulmonary/Chest: Effort normal and breath sounds normal. No respiratory distress. He has no wheezes. He has no rales.  Musculoskeletal: Normal  range of motion. He exhibits no edema.  Upper / Lower Extremities: - Normal muscle tone, strength bilateral upper extremities 5/5, lower extremities 5/5  Left Knee - unchanged today Inspection: Normal appearance and symmetrical. No ecchymosis or effusion. Palpation: Non-tender. Mild crepitus ROM: Full active ROM bilaterally Strength: 5/5 intact knee flex/ext, ankle dorsi/plantarflex Neurovascular: distally intact sensation light touch and pulses  Lymphadenopathy:    He has no cervical adenopathy.  Neurological: He is alert and oriented to person, place, and time.  Skin: Skin is warm and dry. No rash noted. He is not diaphoretic. No erythema.  Psychiatric: He has a normal mood and affect. His behavior is normal.  Well groomed, good eye contact, normal speech and thoughts  Nursing note and vitals reviewed.  Results for orders placed or performed during the hospital encounter of 08/31/17  Protime-INR  Result Value Ref Range   Prothrombin Time 12.5 11.4 - 15.2 seconds   INR 0.94   APTT  Result  Value Ref Range   aPTT 27 24 - 36 seconds  CBC  Result Value Ref Range   WBC 8.0 3.8 - 10.6 K/uL   RBC 5.88 4.40 - 5.90 MIL/uL   Hemoglobin 16.9 13.0 - 18.0 g/dL   HCT 16.1 09.6 - 04.5 %   MCV 84.3 80.0 - 100.0 fL   MCH 28.8 26.0 - 34.0 pg   MCHC 34.1 32.0 - 36.0 g/dL   RDW 40.9 81.1 - 91.4 %   Platelets 252 150 - 440 K/uL  Differential  Result Value Ref Range   Neutrophils Relative % 70 %   Neutro Abs 5.7 1.4 - 6.5 K/uL   Lymphocytes Relative 21 %   Lymphs Abs 1.7 1.0 - 3.6 K/uL   Monocytes Relative 7 %   Monocytes Absolute 0.5 0.2 - 1.0 K/uL   Eosinophils Relative 1 %   Eosinophils Absolute 0.1 0 - 0.7 K/uL   Basophils Relative 1 %   Basophils Absolute 0.1 0 - 0.1 K/uL  Comprehensive metabolic panel  Result Value Ref Range   Sodium 136 135 - 145 mmol/L   Potassium 4.2 3.5 - 5.1 mmol/L   Chloride 104 101 - 111 mmol/L   CO2 25 22 - 32 mmol/L   Glucose, Bld 204 (H) 65 - 99  mg/dL   BUN 12 6 - 20 mg/dL   Creatinine, Ser 7.82 0.61 - 1.24 mg/dL   Calcium 8.9 8.9 - 95.6 mg/dL   Total Protein 6.7 6.5 - 8.1 g/dL   Albumin 3.9 3.5 - 5.0 g/dL   AST 31 15 - 41 U/L   ALT 34 17 - 63 U/L   Alkaline Phosphatase 87 38 - 126 U/L   Total Bilirubin 0.8 0.3 - 1.2 mg/dL   GFR calc non Af Amer >60 >60 mL/min   GFR calc Af Amer >60 >60 mL/min   Anion gap 7 5 - 15  Troponin I  Result Value Ref Range   Troponin I <0.03 <0.03 ng/mL  HIV antibody (Routine Testing)  Result Value Ref Range   HIV Screen 4th Generation wRfx Non Reactive Non Reactive  TSH  Result Value Ref Range   TSH 1.569 0.350 - 4.500 uIU/mL  Troponin I  Result Value Ref Range   Troponin I <0.03 <0.03 ng/mL  Troponin I  Result Value Ref Range   Troponin I <0.03 <0.03 ng/mL  Troponin I  Result Value Ref Range   Troponin I <0.03 <0.03 ng/mL  Hemoglobin A1c  Result Value Ref Range   Hgb A1c MFr Bld 7.5 (H) 4.8 - 5.6 %   Mean Plasma Glucose 169 mg/dL  Lipid panel  Result Value Ref Range   Cholesterol 277 (H) 0 - 200 mg/dL   Triglycerides 213 (H) <150 mg/dL   HDL 27 (L) >08 mg/dL   Total CHOL/HDL Ratio 10.3 RATIO   VLDL UNABLE TO CALCULATE IF TRIGLYCERIDE OVER 400 mg/dL 0 - 40 mg/dL   LDL Cholesterol UNABLE TO CALCULATE IF TRIGLYCERIDE OVER 400 mg/dL 0 - 99 mg/dL  Basic metabolic panel  Result Value Ref Range   Sodium 137 135 - 145 mmol/L   Potassium 3.7 3.5 - 5.1 mmol/L   Chloride 104 101 - 111 mmol/L   CO2 24 22 - 32 mmol/L   Glucose, Bld 149 (H) 65 - 99 mg/dL   BUN 14 6 - 20 mg/dL   Creatinine, Ser 6.57 0.61 - 1.24 mg/dL   Calcium 9.0 8.9 - 84.6 mg/dL   GFR calc  non Af Amer >60 >60 mL/min   GFR calc Af Amer >60 >60 mL/min   Anion gap 9 5 - 15  CBC  Result Value Ref Range   WBC 8.6 3.8 - 10.6 K/uL   RBC 5.73 4.40 - 5.90 MIL/uL   Hemoglobin 16.6 13.0 - 18.0 g/dL   HCT 16.148.3 09.640.0 - 04.552.0 %   MCV 84.3 80.0 - 100.0 fL   MCH 29.0 26.0 - 34.0 pg   MCHC 34.4 32.0 - 36.0 g/dL   RDW 40.913.8 81.111.5 -  91.414.5 %   Platelets 239 150 - 440 K/uL      Assessment & Plan:   Problem List Items Addressed This Visit    CAD (coronary artery disease)    Stable without angina S/p MI and RCA Stent year 2000, initial plavix then off only on ASA but non adherent [facility], has seen Dr Kirke CorinArida 2016  Plan: 1. Continue DAPT - ASA 81 and Plavix 75mg  daily - will likely continue plavix 75 monotherapy indefinitely - discussed uncertain duration of ASA 81 - for now I would recommend continue both post CVA - will request discussion with Cardiology next - [facility] Dr Kirke CorinArida review med management including DAPT 2. Continue med management ACEi, BB, Statin - (note reduced BB dose to reduce side effect possibly)      Relevant Medications   clopidogrel (PLAVIX) 75 MG tablet   atorvastatin (LIPITOR) 40 MG tablet   atenolol (TENORMIN) 25 MG tablet   Chronic pain of left knee    No change, Stable chronic problem Pain worse only at night when rest, history suggestive of arthritis, also more muscle fatigue related to CVA, vs possible med side effect ?statin vs BB fatigue No new injury Prior imaging x-rays not available, prior ortho  Plan Start topical diclofenac NSAID for PRN use goal for relief at night and better function less fatigue if pain improved May use Flexeril PRN at night - he has rarely used so far In future may consider X-rays update, possibly referral back to ortho      Chronic sinusitis    Stable chronic post nasal drainage, without active sinusitis infection Prior history negative work-up GERD, ENT Allergist Failed Flonase years ago  Plan 1. Trial on Atrovent - BID daily use then may use QID PRN flare 2. Discussed Dymista or other combo therapy for anti-histamine nasal and nasal steroid in future 3. May need ENT in future      Relevant Medications   ipratropium (ATROVENT) 0.06 % nasal spray   Essential hypertension - Primary    Still improving  HTN control now on ARB off ACEi, goal BP < 135/85 - Home BP readings detailed report BID readings - AM higher avg and PM controlled - note AM readings are BEFORE meds Complicated with history of CAD, s/p MI, CVA    Plan:  1. REDUCE Atenolol from 25mg  daily to HALF tab 12.5mg  DAILY - see if will improve energy and reduce fatigue - Continue current Losartan 50mg  daily for now - if needed may increase Losartan to 75mg  (1.5 tabs), or max dose 100mg  in future - HR in 60s avg, discussed avoiding increasing BB in future 2. Encourage improved lifestyle - low sodium diet, regular exercise 3. Continue monitor BP outside office, bring readings to next visit, if persistently >135/85 or new symptoms notify office sooner 4. Follow-up 3 months for HTN, med adjust - may need switch BB or inc ARB in  future  Return to [facility] Dr Kirke Corin review BP meds among other med management, follow-up CAD      Relevant Medications   atorvastatin (LIPITOR) 40 MG tablet   atenolol (TENORMIN) 25 MG tablet   Other Relevant Orders   COMPLETE METABOLIC PANEL WITH GFR   History of cerebrovascular accident (CVA) with residual deficit    Still gradually improving residual deficits L sided weakness and word finding/speech (worse in evening due to fatigue), approaching baseline S/p R acute pontine CVA with hospitalization 12/15 to 12/17 WIthout outpatient PT/OT patient declined Not followed by Neurology Not consisted ASA failure since was only intermittently on this prior to stroke  Plan: 1. Continue DAPT ASA 81 and Plavix 75mg  daily for at least 3 weeks - then likely switch to Plavix 75mg  monotherapy, otherwise concern with known CAD MI and stent, could benefit from lifelong DAPT - will reconsider in future - Return to Cardiology to re-evaluate DAPT and med management 2. Continue working on home rehab exercises 3. Control BP - see A&P 4. Follow-up sooner if any concerns or worsening, strict return to ED criteria,  discussed risk of recurrence stroke, future may refer to Neurology if other concerns post CVA      Relevant Medications   clopidogrel (PLAVIX) 75 MG tablet   Hyperlipidemia    Not due yet for repeat lipids. Uncontrolled cholesterol previously non adherent to statin / intolerance, and poor lifestyle Last lipid panel 08/2017 Known ASCVD risk CAD, CVA  Plan: 1. Continue current meds - Lipitor 40mg  daily - still recommend to ADD CoQ10 trial OTC - if not improve we can switch back to Crestor to see if helps, otherwise may have to consider Livalo vs PCSK9 inhibitor 2. Continue ASA 81mg  + Plavix 75mg  for secondary ASCVD risk reduction 3. Encourage improved lifestyle - low carb/cholesterol, reduce portion size, continue improving regular exercise 4. Re-check fasting lipids within 3 months  F/u Cardiology      Relevant Medications   atorvastatin (LIPITOR) 40 MG tablet   atenolol (TENORMIN) 25 MG tablet   Other Relevant Orders   Lipid panel    Other Visit Diagnoses    Abnormal glucose       No prior history of elevated A1c, recently in hospital A1c 7.5, no other results for comparison. Not focus of visit today, will re-check A1c   Relevant Orders   Hemoglobin A1c   Need for hepatitis C screening test       Relevant Orders   Hepatitis C antibody   Fatigue, unspecified type       Relevant Orders   VITAMIN D 25 Hydroxy (Vit-D Deficiency, Fractures)      Meds ordered this encounter  Medications  . ipratropium (ATROVENT) 0.06 % nasal spray    Sig: Use 2 sprays in each nostril twice daily, or may use up to 4 times daily PRN flare    Dispense:  15 mL    Refill:  3  . clopidogrel (PLAVIX) 75 MG tablet    Sig: Take 1 tablet (75 mg total) by mouth daily.    Dispense:  90 tablet    Refill:  3  . atorvastatin (LIPITOR) 40 MG tablet    Sig: Take 1 tablet (40 mg total) by mouth daily at 6 PM.    Dispense:  90 tablet    Refill:  3  . atenolol (TENORMIN) 25 MG tablet    Sig: Take 1 tablet  (25 mg total) by mouth daily.  Dispense:  30 tablet    Refill:  2    Follow up plan: Return in about 3 months (around 01/02/2018) for HTN, Low Energy, postCVA, Cardiology.  Future fasting labs 12/2017  Saralyn Pilar, DO [facility] 10/04/2017, 4:51 PM

## 2017-12-16 ENCOUNTER — Other Ambulatory Visit: Payer: Self-pay | Admitting: Family Medicine

## 2017-12-16 DIAGNOSIS — I10 Essential (primary) hypertension: Secondary | ICD-10-CM

## 2017-12-26 ENCOUNTER — Other Ambulatory Visit: Payer: BLUE CROSS/BLUE SHIELD

## 2017-12-26 DIAGNOSIS — I10 Essential (primary) hypertension: Secondary | ICD-10-CM

## 2017-12-26 DIAGNOSIS — Z1159 Encounter for screening for other viral diseases: Secondary | ICD-10-CM

## 2017-12-26 DIAGNOSIS — R5383 Other fatigue: Secondary | ICD-10-CM

## 2017-12-26 DIAGNOSIS — E782 Mixed hyperlipidemia: Secondary | ICD-10-CM

## 2017-12-26 DIAGNOSIS — R7309 Other abnormal glucose: Secondary | ICD-10-CM

## 2017-12-27 ENCOUNTER — Encounter: Payer: Self-pay | Admitting: Family Medicine

## 2017-12-27 DIAGNOSIS — E1169 Type 2 diabetes mellitus with other specified complication: Secondary | ICD-10-CM | POA: Insufficient documentation

## 2017-12-27 LAB — LIPID PANEL
CHOLESTEROL: 183 mg/dL (ref <200)
HDL: 28 mg/dL — ABNORMAL LOW (ref >40)
LDL CHOLESTEROL (CALC): 104 mg/dL — AB
Non-HDL Cholesterol (Calc): 155 mg/dL (calc) — ABNORMAL HIGH (ref <130)
TRIGLYCERIDES: 385 mg/dL — AB (ref <150)
Total CHOL/HDL Ratio: 6.5 (calc) — ABNORMAL HIGH (ref <5.0)

## 2017-12-27 LAB — COMPLETE METABOLIC PANEL WITH GFR
AG Ratio: 1.9 (calc) (ref 1.0–2.5)
ALKALINE PHOSPHATASE (APISO): 90 U/L (ref 40–115)
ALT: 45 U/L (ref 9–46)
AST: 21 U/L (ref 10–35)
Albumin: 4.3 g/dL (ref 3.6–5.1)
BILIRUBIN TOTAL: 0.7 mg/dL (ref 0.2–1.2)
BUN: 17 mg/dL (ref 7–25)
CHLORIDE: 104 mmol/L (ref 98–110)
CO2: 30 mmol/L (ref 20–32)
Calcium: 9.2 mg/dL (ref 8.6–10.3)
Creat: 0.84 mg/dL (ref 0.70–1.33)
GFR, EST AFRICAN AMERICAN: 113 mL/min/{1.73_m2} (ref >=60)
GFR, Est Non African American: 98 mL/min/{1.73_m2} (ref >=60)
GLUCOSE: 194 mg/dL — AB (ref 65–99)
Globulin: 2.3 g/dL (calc) (ref 1.9–3.7)
Potassium: 4.5 mmol/L (ref 3.5–5.3)
Sodium: 140 mmol/L (ref 135–146)
TOTAL PROTEIN: 6.6 g/dL (ref 6.1–8.1)

## 2017-12-27 LAB — HEMOGLOBIN A1C
EAG (MMOL/L): 7.7 (calc)
Hgb A1c MFr Bld: 6.5 % of total Hgb — ABNORMAL HIGH (ref <5.7)
Mean Plasma Glucose: 140 (calc)

## 2017-12-27 LAB — VITAMIN D 25 HYDROXY (VIT D DEFICIENCY, FRACTURES): VIT D 25 HYDROXY: 15 ng/mL — AB (ref 30–100)

## 2017-12-27 LAB — HEPATITIS C ANTIBODY
Hepatitis C Ab: NONREACTIVE
SIGNAL TO CUT-OFF: 0.01 (ref <1.00)

## 2018-01-02 ENCOUNTER — Encounter: Payer: Self-pay | Admitting: Family Medicine

## 2018-01-02 ENCOUNTER — Ambulatory Visit: Payer: BLUE CROSS/BLUE SHIELD | Admitting: Family Medicine

## 2018-01-02 VITALS — BP 133/84 | HR 74 | Temp 98.3°F | Resp 16 | Ht 70.0 in | Wt 222.6 lb

## 2018-01-02 DIAGNOSIS — E782 Mixed hyperlipidemia: Secondary | ICD-10-CM | POA: Diagnosis not present

## 2018-01-02 DIAGNOSIS — R7309 Other abnormal glucose: Secondary | ICD-10-CM

## 2018-01-02 DIAGNOSIS — E559 Vitamin D deficiency, unspecified: Secondary | ICD-10-CM

## 2018-01-02 DIAGNOSIS — I10 Essential (primary) hypertension: Secondary | ICD-10-CM | POA: Diagnosis not present

## 2018-01-02 DIAGNOSIS — Z1211 Encounter for screening for malignant neoplasm of colon: Secondary | ICD-10-CM

## 2018-01-02 MED ORDER — VITAMIN D3 125 MCG (5000 UT) PO CAPS: 5000.0000 [IU] | ORAL_CAPSULE | Freq: Every day | ORAL | 2 refills | Status: AC

## 2018-01-02 MED ORDER — ROSUVASTATIN CALCIUM 40 MG PO TABS: 40.0000 mg | ORAL_TABLET | Freq: Every day | ORAL | 5 refills | Status: DC

## 2018-01-02 NOTE — Progress Notes (Signed)
Subjective:    Patient ID: [name] [name], male    DOB: 1960-11-26, 57 y.o.   MRN: [id number]  [name] [name] is a 57 y.o. male presenting on 01/02/2018 for Hypertension  History provided by patient and also wife, [name].  HPI    CHRONIC HTN: - Today patient reports BP seems improved overall but still high in morning, reviewed log, PM readings are improved SBP 120-130s Current Meds -Atenolol 12.5mg  daily (HALF of 25mg  daily), Losartan 50mg  daily Reports good compliance, took meds today. Tolerating well, w/o complaints. 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 Previously seen by [facility] in [place], now last visit Cardiology 12/2014 Dr Kirke Corin. It appears stress test was ordered in 2016 but never completed due to patient re-scheduling - Today he continues DAPT with ASA 81 and Plavix 75mg  after CVA - He is scheduled to see Cardiology in May 2019  Elevated A1c Reports concerns with family history of DM, he was unaware of A1c 7.5 from hospitalization recently. Now re-check A1c showed 6.5, never dx before with PreDM Not checking CBG Meds: never on meds before Currently on ARB Lifestyle: - Diet (improved overall but still not limiting carbs, drinks sodas and alcohol liquor mostly 6 drinks 2 days a week other days none)  - Exercise (limited exercise now, partially due to leg and muscle fatigue and soreness) Denies hypoglycemia, polyuria, visual changes, numbness or tingling.  HYPERLIPIDEMIA: - Last lipid panel 12/2017, elevated TG (chronic problem) and borderline elevated LDL 104 and low HDL - Currently taking Atorvastatin 40mg  daily, but has symptoms of myalgias and muscle soreness. - He has similar problem side effect myalgia on this years ago, he stopped it  eventually, then resumed it after recent CVA event.  Health Maintenance: - Colon CA Screening: No prior screening. He is interested in Cologuard, says it is covered, request order today.  Depression screen [facility] 2/9 01/02/2018 10/04/2017 09/06/2017  Decreased Interest 0 0 0  Down, Depressed, Hopeless 0 0 0  PHQ - 2 Score 0 0 0    Social History   Tobacco Use  . Smoking status: Former Smoker    Last attempt to quit: 05/19/1999    Years since quitting: 18.6  . Smokeless tobacco: Former Engineer, water Use Topics  . Alcohol use: Yes    Comment: 8-10 oz, twice weekly  . Drug use: No    Comment: last in 1980's    Review of Systems Per HPI unless specifically indicated above     Objective:    BP 133/84 (BP Location: Left Arm, Cuff Size: Normal)   Pulse 74   Temp 98.3 F (36.8 C) (Oral)   Resp 16   Ht 5\' 10"  (1.778 m)   Wt 222 lb 9.6 oz (101 kg)   BMI 31.94 kg/m   Wt Readings from Last 3 Encounters:  01/02/18 222 lb 9.6 oz (101 kg)  10/04/17 221 lb (100.2 kg)  09/06/17 221 lb 9.6 oz (100.5 kg)    Physical Exam  Constitutional: He is oriented to person, place, and time. He appears well-developed and well-nourished. No distress.  Well-appearing, comfortable, cooperative, overweight  HENT:  Head: Normocephalic and atraumatic.  Mouth/Throat: Oropharynx is clear and moist.  Eyes: Conjunctivae are normal. Right eye exhibits no  discharge. Left eye exhibits no discharge.  Cardiovascular: Normal rate, regular rhythm, normal heart sounds and intact distal pulses.  No murmur heard. Pulmonary/Chest: Effort normal.  Musculoskeletal: He exhibits no edema.  Neurological: He is alert and oriented to person, place, and time.  Skin: Skin is warm and dry. No rash noted. He is not diaphoretic. No erythema.  Psychiatric: He has a normal mood and affect. His behavior is normal.  Well groomed, good eye contact, normal speech and thoughts  Nursing note and vitals reviewed.  Results for  orders placed or performed in visit on 12/26/17  Hepatitis C antibody  Result Value Ref Range   Hepatitis C Ab NON-REACTIVE NON-REACTI   SIGNAL TO CUT-OFF 0.01 <1.00  VITAMIN D 25 Hydroxy (Vit-D Deficiency, Fractures)  Result Value Ref Range   Vit D, 25-Hydroxy 15 (L) 30 - 100 ng/mL  Hemoglobin A1c  Result Value Ref Range   Hgb A1c MFr Bld 6.5 (H) <5.7 % of total Hgb   Mean Plasma Glucose 140 (calc)   eAG (mmol/L) 7.7 (calc)  COMPLETE METABOLIC PANEL WITH GFR  Result Value Ref Range   Glucose, Bld 194 (H) 65 - 99 mg/dL   BUN 17 7 - 25 mg/dL   Creat 4.54 0.98 - 1.19 mg/dL   GFR, Est Non African American 98 > OR = 60 mL/min/1.5m2   GFR, Est African American 113 > OR = 60 mL/min/1.35m2   BUN/Creatinine Ratio NOT APPLICABLE 6 - 22 (calc)   Sodium 140 135 - 146 mmol/L   Potassium 4.5 3.5 - 5.3 mmol/L   Chloride 104 98 - 110 mmol/L   CO2 30 20 - 32 mmol/L   Calcium 9.2 8.6 - 10.3 mg/dL   Total Protein 6.6 6.1 - 8.1 g/dL   Albumin 4.3 3.6 - 5.1 g/dL   Globulin 2.3 1.9 - 3.7 g/dL (calc)   AG Ratio 1.9 1.0 - 2.5 (calc)   Total Bilirubin 0.7 0.2 - 1.2 mg/dL   Alkaline phosphatase (APISO) 90 40 - 115 U/L   AST 21 10 - 35 U/L   ALT 45 9 - 46 U/L  Lipid panel  Result Value Ref Range   Cholesterol 183 <200 mg/dL   HDL 28 (L) >14 mg/dL   Triglycerides 782 (H) <150 mg/dL   LDL Cholesterol (Calc) 104 (H) mg/dL (calc)   Total CHOL/HDL Ratio 6.5 (H) <5.0 (calc)   Non-HDL Cholesterol (Calc) 155 (H) <130 mg/dL (calc)      Assessment & Plan:   Problem List Items Addressed This Visit    Elevated hemoglobin A1c - Primary    Now improved A1c from 7.5 to 6.5, at risk of new dx Type 2 Diabetes, no prior dx predm before Likely cause of underlying tiredness and some fatigue symptoms Concern with obesity, HTN, HLD  Plan:  1. Not on any therapy currently - discussion today on risk of diagnosis management, complications and treatment - Ultimately we agree to emphasize lifestyle now that he is  aware of potential diagnosis and proceed without meds until repeat check in 3 months 2. Encourage improved lifestyle - low carb, low sugar diet, reduce portion size, start regular exercise - if myalgias improved, handout given on glycemic portions 3. Follow-up 3 months lab A1c for accuracy, if >6.5 new dx T2DM, otherwise may consider add metformin      Relevant Orders   Hemoglobin A1c   Essential hypertension    Still improved HTN control now on ARB. Limited change with lower dose  Atenolol - Home BP readings detailed report BID readings - AM higher avg and PM controlled Complicated with history of CAD, s/p MI, CVA    Plan:  1. For now may continue HALF dose Atenolol at 12.5mg  daily - do not want to make too many changes at once since changing statin for now, he will likely need to return to full dose 25mg  daily in future if determined fatigue was from myalgia - Continue current Losartan 50mg  daily for now - if needed may increase Losartan to 75mg  (1.5 tabs), or max dose 100mg  in future 2. Encourage improved lifestyle - low sodium diet, regular exercise 3. Continue monitor BP outside office, bring readings to next visit, if persistently >135/85 or new symptoms notify office sooner 4. Follow-up with Cardiology for BP as well, and return 3 months for A1c review BP meds      Relevant Medications   rosuvastatin (CRESTOR) 40 MG tablet   Hyperlipidemia    Elevated TG and low HDL, still LDL >100 Last lipid panel 4/09814/2019 Known ASCVD risk CAD, CVA Seems to have myalgia on Atorva 40  Plan: 1. DISCONTINUE Atorvastatin 40mg  daily - due to myalgia statin, hold statin for 7-10 days if resolve symptoms then know more accurate dx instead of other fatigue - START new Rosuvastatin 40mg  daily bedtime next - in future if not tolerated well again due to myalgia can reduce to half for 20mg , or in future intermittent dosing - Continue other therapy ASA + Plavix - Discuss med options with Cardiology    Encourage improved lifestyle - low carb/cholesterol, reduce portion size, continue improving regular exercise Defer lipids in 3 months can re-check in 6-12 months      Relevant Medications   rosuvastatin (CRESTOR) 40 MG tablet   Vitamin D deficiency    Low Vit D 15, may be contributing to fatigue Start Vitamin D3 5,000 iu daily for 12 weeks Then reduce to 2k daily maintenance      Relevant Medications   Cholecalciferol (VITAMIN D3) 5000 units CAPS    Other Visit Diagnoses    Screening for colon cancer       Relevant Orders   Cologuard      Meds ordered this encounter  Medications  . rosuvastatin (CRESTOR) 40 MG tablet    Sig: Take 1 tablet (40 mg total) by mouth daily.    Dispense:  30 tablet    Refill:  5    Stop Atorvastatin, switch to Rosuvastatin  . Cholecalciferol (VITAMIN D3) 5000 units CAPS    Sig: Take 1 capsule (5,000 Units total) by mouth daily. For 12 weeks, then start Vitamin D3 2,000 units daily (OTC)    Dispense:  30 capsule    Refill:  2   Follow up plan: Return in about 3 months (around 04/03/2018) for Elevated A1c, HLD Statin Fatigue.  Future labs ordered for Hemoglobin A1c 03/2018  Saralyn PilarAlexander Kendra Woolford, DO [facility] 01/02/2018, 4:54 PM

## 2018-01-02 NOTE — Assessment & Plan Note (Addendum)
Now improved A1c from 7.5 to 6.5, at risk of new dx Type 2 Diabetes, no prior dx predm before Likely cause of underlying tiredness and some fatigue symptoms Concern with obesity, HTN, HLD  Plan:  1. Not on any therapy currently - discussion today on risk of diagnosis management, complications and treatment - Ultimately we agree to emphasize lifestyle now that he is aware of potential diagnosis and proceed without meds until repeat check in 3 months 2. Encourage improved lifestyle - low carb, low sugar diet, reduce portion size, start regular exercise - if myalgias improved, handout given on glycemic portions 3. Follow-up 3 months lab A1c for accuracy, if >6.5 new dx T2DM, otherwise may consider add metformin

## 2018-01-02 NOTE — Assessment & Plan Note (Signed)
Elevated TG and low HDL, still LDL >100 Last lipid panel 9/60454/2019 Known ASCVD risk CAD, CVA Seems to have myalgia on Atorva 40  Plan: 1. DISCONTINUE Atorvastatin 40mg  daily - due to myalgia statin, hold statin for 7-10 days if resolve symptoms then know more accurate dx instead of other fatigue - START new Rosuvastatin 40mg  daily bedtime next - in future if not tolerated well again due to myalgia can reduce to half for 20mg , or in future intermittent dosing - Continue other therapy ASA + Plavix - Discuss med options with Cardiology  Encourage improved lifestyle - low carb/cholesterol, reduce portion size, continue improving regular exercise Defer lipids in 3 months can re-check in 6-12 months

## 2018-01-02 NOTE — Assessment & Plan Note (Signed)
Still improved HTN control now on ARB. Limited change with lower dose Atenolol - Home BP readings detailed report BID readings - AM higher avg and PM controlled Complicated with history of CAD, s/p MI, CVA    Plan:  1. For now may continue HALF dose Atenolol at 12.5mg  daily - do not want to make too many changes at once since changing statin for now, he will likely need to return to full dose 25mg  daily in future if determined fatigue was from myalgia - Continue current Losartan 50mg  daily for now - if needed may increase Losartan to 75mg  (1.5 tabs), or max dose 100mg  in future 2. Encourage improved lifestyle - low sodium diet, regular exercise 3. Continue monitor BP outside office, bring readings to next visit, if persistently >135/85 or new symptoms notify office sooner 4. Follow-up with Cardiology for BP as well, and return 3 months for A1c review BP meds

## 2018-01-02 NOTE — Patient Instructions (Addendum)
Thank you for coming to the office today.  STOP Atorvastatin 71m - see if muscle soreness and side effects get better - maybe hold for 3 to 7 days, maybe longer if slowly improving but not quite back to normal  START Rosuvastatin 483mat bedtime once your muscles feel better - hopefully less side effects on this med. In future if still having problem in few weeks or months in future, we may need to lower dose by HALF or change to intermittent dosing, or change meds again  Please discuss the following with Cardiologist in May - Statin medicine change - Review anti platelet medicines - Aspirin and Plavix, and how long you need to be on both, and in future if you may benefit from switch to Xarelto (Plavix)  For blood sugar, elevated A1c still near range of Type 2 Diabetes, Ac >6.5, you had a previous 7.5  Try to reduce soda and carb and sugar intake, also reduce alcohol, improve water and inc exercise   Colon Cancer Screening: - For all adults age 57+outine colon cancer screening is highly recommended.     - Recent guidelines [place] age [place] 57 Early detection of colon cancer is important, because often there are no warning signs or symptoms, also if found early usually it can be cured. Late stage is hard to treat.  - If you are not interested in Colonoscopy screening (if done and normal you could be cleared for 5 to 10 years until next due), then Cologuard is an excellent alternative for screening test for Colon Cancer. It is highly sensitive for detecting DNA of colon cancer from even the earliest stages. Also, there is NO bowel prep required. - If Cologuard is NEGATIVE, then it is good for 3 years before next due - If Cologuard is POSITIVE, then it is strongly advised to get a Colonoscopy, which allows the GI doctor to locate the source of the cancer or polyp (even very early stage) and treat it by removing it. ------------------------- If you would  like to proceed with Cologuard (stool DNA test) - FIRST, call your insurance company and tell them you want to check cost of Cologuard tell them CPT Code [id number] may be completely covered and you could get for no cost, OR max cost without any coverage is about $600). Also, keep in mind if you do NOT open the kit, and decide not to do the test, you will NOT be charged, you should contact the company if you decide not to do the test. - If you want to proceed, you can notify usKoreaphone message, [place] at next visit) and we will order it for you. The test kit will be delivered to you house within about 1 week. Follow instructions to collect sample, you may call the company for any help or questions, 24/7 telephone support at [phone] DUE for FASTING BLOOD WORK (no food or drink after midnight before the lab appointment, only water or coffee without cream/sugar on the morning of)  SCHEDULE "Lab Only" visit in the morning at the clinic for lab draw in 3 MONTHS   - Make sure Lab Only appointment is at about 1 week before your next appointment, so that results will be available  For Lab Results, once available within 2-3 days of blood draw, you can can log in to MyChart online to view your results and a brief explanation. Also, we can discuss results at next  follow-up visit.   Please schedule a Follow-up Appointment to: Return in about 3 months (around 04/03/2018) for Elevated A1c, HLD Statin Fatigue.  If you have any other questions or concerns, please feel free to call the office or send a message through [facility]. You may also schedule an earlier appointment if necessary.  Additionally, you may be receiving a survey about your experience at our office within a few days to 1 week by e-mail or mail. We value your feedback.  Nobie Putnam, DO [place]

## 2018-01-02 NOTE — Assessment & Plan Note (Signed)
Low Vit D 15, may be contributing to fatigue Start Vitamin D3 5,000 iu daily for 12 weeks Then reduce to 2k daily maintenance

## 2018-01-23 ENCOUNTER — Ambulatory Visit: Payer: Self-pay | Admitting: Nurse Practitioner

## 2018-02-04 NOTE — Progress Notes (Signed)
Cardiology Office Note Date:  02/06/2018  Patient ID:  [name] [name], Daily 04/18/1961, MRN [id number] PCP:  Smitty Cords, DO  Cardiologist:  Dr. Kirke Corin, MD    Chief Complaint: Follow-up  History of Present Illness: [name] [name] is a 57 y.o. male with history of CAD status post PCI/stenting to the RCA in 2000, hypertension, hyperlipidemia, recent CVA in 08/2017, prior tobacco abuse quitting at the time of his MI, alcohol abuse, and GERD who presents for follow-up of his stroke.   Patient was last seen by Dr. Kirke Corin on 12/20/2014 (prior to that his most recent visit had been in 2011).  At that time, he was only taking aspirin 81 mg daily.  He was noted to have significant myalgias and intolerance to atorvastatin.  He was tolerating Crestor, however this was not covered by his insurance thus he was having difficulty obtaining this.  Due to his baseline, abnormal EKG, exertional dyspnea, and symptoms concerning for possible GERD, treadmill Myoview was recommended.  However, the patient did not follow through with this.  Patient was admitted to [facility] in 08/2017 with a CVA.  Reportedly, the patient was not taking any medications at the time of admission.  Initial blood pressure 187/106.  CT head nonacute.  MRI of the brain confirmed right sided pontine infarct.  MRI of the head and neck showed no significant arterial stenosis but some question of small aneurysm.  Neurology felt the etiology of his stroke was likely small vessel disease with the patient's multiple small vessel risk factors.  TTE on 09/01/2017 showed an EF of 50%, grade 1 diastolic dysfunction, mildly dilated left atrium, no atrial septal defect or PFO was identified, no evidence of thrombus in the left atrium or left ventricle, bubble study was negative for shunt.  He was seen by neurology with recommendation for dual antiplatelet therapy with aspirin and Plavix.  Discharge medications included atenolol 25 mg daily, Lipitor 40 mg  daily, Plavix 75 mg daily, and lisinopril 20 mg daily.   Most recent labs from 12/26/2017 show an LDL of 104, A1c 6.5, serum creatinine 0.84, potassium 4.5, normal liver function.  He comes in doing well from a cardiac perspective.  He reports from a stroke perspective he continues to get better day by day.  He continues to note fatigue especially in the afternoon hours after he has been on his feet for 8 to 16 hours at work.  He and his wife indicate he is more irritable in the morning.  Blood pressures have been reasonably controlled.  Tolerating medications without issues.  No chest pain, palpitations, shortness of breath, dizziness, nausea, vomiting, presyncope, or syncope.    Past Medical History:  Diagnosis Date  . Anxiety   . CAD (coronary artery disease)    s/p stenting  . GERD (gastroesophageal reflux disease)   . HLD (hyperlipidemia)   . MI (myocardial infarction) (HCC) 2000    Past Surgical History:  Procedure Laterality Date  . CARDIAC CATHETERIZATION     mc  . CORONARY STENT PLACEMENT  2000   RCA, Stent s/p MI    Current Meds  Medication Sig  . aspirin EC 81 MG tablet Take 81 mg by mouth daily.  Marland Kitchen atenolol (TENORMIN) 25 MG tablet Take 1 tablet (25 mg total) by mouth daily. (Patient taking differently: Take 12.5 mg by mouth daily. )  . Cholecalciferol (VITAMIN D3) 5000 units CAPS Take 1 capsule (5,000 Units total) by mouth daily. For 12 weeks, then start Vitamin  D3 2,000 units daily (OTC)  . clopidogrel (PLAVIX) 75 MG tablet Take 1 tablet (75 mg total) by mouth daily.  . cyclobenzaprine (FLEXERIL) 10 MG tablet Take 1 tablet (10 mg total) by mouth 3 (three) times daily as needed for muscle spasms. May take at evening only if preferred  . diclofenac sodium (VOLTAREN) 1 % GEL Apply 2 g topically 3 (three) times daily as needed (knee pain).  Marland Kitchen ipratropium (ATROVENT) 0.06 % nasal spray Use 2 sprays in each nostril twice daily, or may use up to 4 times daily PRN flare  .  losartan (COZAAR) 50 MG tablet TAKE 1 TABLET ONCE DAILY.  . Multiple Vitamins-Minerals (MENS 50+ MULTI VITAMIN/MIN PO) Take by mouth daily.  . rosuvastatin (CRESTOR) 40 MG tablet Take 1 tablet (40 mg total) by mouth daily.    Allergies:   Patient has no known allergies.   Social History:  The patient  reports that he quit smoking about 18 years ago. He has quit using smokeless tobacco. He reports that he drinks alcohol. He reports that he does not use drugs.   Family History:  The patient's family history includes Alcohol abuse in his father; Arthritis in his mother; Colitis in his unknown relative; Colon polyps in his father; Diabetes in his brother, father, mother, and sister; Heart disease in his father and unknown relative; Hyperlipidemia in his father and mother; Hypertension in his father and mother.  ROS:   Review of Systems  Constitutional: Positive for malaise/fatigue. Negative for chills, diaphoresis, fever and weight loss.  HENT: Negative for congestion.   Eyes: Negative for discharge and redness.  Respiratory: Negative for cough, hemoptysis, sputum production, shortness of breath and wheezing.   Cardiovascular: Negative for chest pain, palpitations, orthopnea, claudication, leg swelling and PND.  Gastrointestinal: Negative for abdominal pain, blood in stool, heartburn, melena, nausea and vomiting.  Genitourinary: Negative for hematuria.  Musculoskeletal: Negative for falls and myalgias.  Skin: Negative for rash.  Neurological: Positive for weakness. Negative for dizziness, tingling, tremors, sensory change, speech change, focal weakness and loss of consciousness.  Endo/Heme/Allergies: Does not bruise/bleed easily.  Psychiatric/Behavioral: Negative for substance abuse. The patient is not nervous/anxious.        Irritable     PHYSICAL EXAM:  VS:  BP 132/60 (BP Location: Left Arm, Patient Position: Sitting, Cuff Size: Normal)   Pulse (!) 57   Ht  (1.778 m)   Wt 224 lb  (101.6 kg)   BMI 32.14 kg/m  BMI: Body mass index is 32.14 kg/m.  Physical Exam  Constitutional: He is oriented to person, place, and time. He appears well-developed and well-nourished.  HENT:  Head: Normocephalic and atraumatic.  Eyes: Right eye exhibits no discharge. Left eye exhibits no discharge.  Neck: Normal range of motion. No JVD present.  Cardiovascular: Normal rate, regular rhythm, S1 normal, S2 normal and normal heart sounds. Exam reveals no distant heart sounds, no friction rub, no midsystolic click and no opening snap.  No murmur heard. Pulses:      Posterior tibial pulses are 2+ on the right side, and 2+ on the left side.  Pulmonary/Chest: Effort normal and breath sounds normal. No respiratory distress. He has no decreased breath sounds. He has no wheezes. He has no rales. He exhibits no tenderness.  Abdominal: Soft. He exhibits no distension. There is no tenderness.  Musculoskeletal: He exhibits no edema.  Neurological: He is alert and oriented to person, place, and time.  Skin: Skin is warm and  dry. No cyanosis. Nails show no clubbing.  Psychiatric: He has a normal mood and affect. His speech is normal and behavior is normal. Judgment and thought content normal.     EKG:  Was ordered and interpreted by me today. Shows sinus bradycardia, 57 bpm, LVH, prior inferior infarct, nonspecific ST-T changes  Recent Labs: 08/31/2017: TSH 1.569 09/01/2017: Hemoglobin 16.6; Platelets 239 12/26/2017: ALT 45; BUN 17; Creat 0.84; Potassium 4.5; Sodium 140  08/31/2017: VLDL UNABLE TO CALCULATE IF TRIGLYCERIDE OVER 400 mg/dL 12/24/8117: Cholesterol 183; HDL 28; LDL Cholesterol (Calc) 104; Total CHOL/HDL Ratio 6.5; Triglycerides 385   CrCl cannot be calculated (Patient's most recent lab result is older than the maximum 21 days allowed.).   Wt Readings from Last 3 Encounters:  02/06/18 224 lb (101.6 kg)  01/02/18 222 lb 9.6 oz (101 kg)  10/04/17 221 lb (100.2 kg)     Other studies  reviewed: Additional studies/records reviewed today include: summarized above  ASSESSMENT AND PLAN:  1. History of stroke: Continues to improve gradually on a daily basis.  Has not followed up with neurology.  Custody evaluation process from a cardiac perspective regarding his stroke.  He would like to move forward with a loop recorder at this time.  He would like to defer TEE.  Recommend he establish with neurology, will defer this to PCP.  Will defer management of his dual antiplatelet therapy to PCP/neurology given this was added for a noncardiac issue.  Defer repeat imaging of the head with a CTA as recommended by radiology at the time of his stroke to PCP.  Continue aggressive risk factor modification with optimal blood pressure, heart rate, and lipid control.  2. CAD of native coronary arteries without angina: No symptoms concerning for chest pain at this time.  Patient was previously scheduled for stress testing several years back though this was not followed through as patient lost his insurance.  Given the patient is asymptomatic at this time we will defer rescheduling this at this time.  Continue dual antiplatelet therapy with aspirin and Plavix under the guidance of PCP/neurology.  Will defer transition to monotherapy to them.  3. Hyperlipidemia: Most recent LDL of 104 from 12/2017.  Remains on Crestor 40 mg daily.  Consider adding Zetia 10 mg daily.  Managed by PCP.  4. Hypertension: Blood pressure is reasonably controlled today.  Continue current medications.  5. Snoring/suspected sleep apnea: Patient declines referral to pulmonology for sleep study at this time.  Risks of untreated sleep apnea were discussed in detail and patient understands these.  He will let us know if he would like to schedule this.  Disposition: F/u with Dr. Kirke Corin in 2 months.  Current medicines are reviewed at length with the patient today.  The patient did not have any concerns regarding  medicines.  Signed, Eula Listen, PA-C 02/06/2018 11:47 AM     [facility] [address] [place], [place] [address] [phone]

## 2018-02-06 ENCOUNTER — Encounter: Payer: Self-pay | Admitting: Physician Assistant

## 2018-02-06 ENCOUNTER — Ambulatory Visit: Payer: BLUE CROSS/BLUE SHIELD | Admitting: Physician Assistant

## 2018-02-06 VITALS — BP 132/60 | HR 57 | Ht 70.0 in | Wt 224.0 lb

## 2018-02-06 DIAGNOSIS — I2510 Atherosclerotic heart disease of native coronary artery without angina pectoris: Secondary | ICD-10-CM | POA: Diagnosis not present

## 2018-02-06 DIAGNOSIS — E785 Hyperlipidemia, unspecified: Secondary | ICD-10-CM | POA: Diagnosis not present

## 2018-02-06 DIAGNOSIS — I10 Essential (primary) hypertension: Secondary | ICD-10-CM | POA: Diagnosis not present

## 2018-02-06 DIAGNOSIS — R0683 Snoring: Secondary | ICD-10-CM | POA: Diagnosis not present

## 2018-02-06 DIAGNOSIS — I6930 Unspecified sequelae of cerebral infarction: Secondary | ICD-10-CM | POA: Diagnosis not present

## 2018-02-06 NOTE — Patient Instructions (Signed)
Medication Instructions:  Your physician recommends that you continue on your current medications as directed. Please refer to the Current Medication list given to you today.   Labwork: None ordered  Testing/Procedures: None ordered  Follow-Up: You have been referred to DR. Graciela Husbands for an evaluation for a loop recorder  Your physician recommends that you schedule a follow-up appointment in: 2 months with Dr.Arida    Any Other Special Instructions Will Be Listed Below (If Applicable).     If you need a refill on your cardiac medications before your next appointment, please call your pharmacy.

## 2018-03-27 ENCOUNTER — Other Ambulatory Visit: Payer: Self-pay

## 2018-04-03 ENCOUNTER — Encounter: Payer: Self-pay | Admitting: Internal Medicine

## 2018-04-03 ENCOUNTER — Ambulatory Visit: Payer: Self-pay | Admitting: Family Medicine

## 2018-04-03 ENCOUNTER — Ambulatory Visit (INDEPENDENT_AMBULATORY_CARE_PROVIDER_SITE_OTHER): Payer: BLUE CROSS/BLUE SHIELD | Admitting: Internal Medicine

## 2018-04-03 VITALS — BP 136/94 | HR 68 | Ht 70.0 in | Wt 226.0 lb

## 2018-04-03 DIAGNOSIS — I6930 Unspecified sequelae of cerebral infarction: Secondary | ICD-10-CM | POA: Diagnosis not present

## 2018-04-03 NOTE — Progress Notes (Signed)
ELECTROPHYSIOLOGY CONSULT NOTE  Patient ID: [name] [name] MRN: [id number], DOB/AGE: 09/22/1960   Admit date: (Not on file) Date of Consult: 04/03/2018  Primary Physician: Smitty CordsKaramalegos, Alexander J, DO Primary Cardiologist: MA Reason for Consultation: Cryptogenic stroke; recommendations regarding Implantable Loop Recorder  History of Present Illness EP has been asked to evaluate [name] Kluvericky Rech for placement of an implantable loop recorder to monitor for atrial fibrillation by Dr Arida/RD.  The patient was admitted on 12/18 Imaging demonstrated R pontine infarct.  he has undergone workup for stroke including echocardiogram and carotid dopplers.  The patient has been monitored on telemetry which has demonstrated sinus rhythm with no arrhythmias.     Echocardiogram demonstrated  EF 50% mild LAE  Rx with DAPT and statins  LDL 104 4/19 .     Date Cr K  4//19 0.84 4.5          Prior to admission, the patient denies  chest pain, shortness of breath, dizziness, palpitations, or syncope.    He has hyperlipidemia and hypertriglyceridemia.  He has been poorly tolerant of statins.  He comes with multiple questions.  No history of palpitations.  No history of atrial fibrillation.   Past Medical History:  Diagnosis Date  . Anxiety   . CAD (coronary artery disease)    s/p stenting  . GERD (gastroesophageal reflux disease)   . HLD (hyperlipidemia)   . MI (myocardial infarction) (HCC) 2000     Surgical History:  Past Surgical History:  Procedure Laterality Date  . CARDIAC CATHETERIZATION     mc  . CORONARY STENT PLACEMENT  2000   RCA, Stent s/p MI      (Not in a hospital admission)  Inpatient Medications:   Allergies: No Known Allergies  Social History   Socioeconomic History  . Marital status: Married    Spouse name: Tamela OddiBetsy  . Number of children: 0  . Years of education: College  . Highest education level: Bachelor's degree (e.g., BA, AB, BS)  Occupational History  .  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 Financial resource strain: Not hard at all  . Food insecurity:    Worry: Not on file    Inability: Not on file  . Transportation needs:    Medical: Not on file    Non-medical: Not on file  Tobacco Use  . Smoking status: Former Smoker    Last attempt to quit: 05/19/1999    Years since quitting: 18.8  . Smokeless tobacco: Former Engineer, [employer]  Substance and Sexual Activity  . Alcohol use: Yes    Comment: 8-10 oz, twice weekly  . Drug use: No    Comment: last in 1980's  . Sexual activity: Yes  Lifestyle  . Physical activity:    Days per week: Not on file    Minutes per session: Not on file  . Stress: Not on file  Relationships  . Social connections:    Talks on phone: Not on file    Gets together: Not on file    Attends religious service: Not on file    Active member of club or organization: Not on file    Attends meetings of clubs or organizations: Not on file    Relationship status: Not on file  . Intimate partner violence:    Fear of current or ex partner: Not on file    Emotionally abused: Not on file    Physically abused: Not on  file    Forced sexual activity: Not on file  Other Topics Concern  . Not on file  Social History Narrative  . Not on file     Family History  Problem Relation Age of Onset  . Hypertension Mother   . Hyperlipidemia Mother   . Arthritis Mother   . Diabetes Mother   . Hypertension Father   . Hyperlipidemia Father   . Alcohol abuse Father   . Diabetes Father   . Heart disease Father        arrhythmia  . Colon polyps Father   . Colitis Unknown   . Heart disease Unknown   . Diabetes Sister   . Diabetes Brother       Review of Systems: All other systems reviewed and are otherwise negative except as noted above.  Physical Exam: Vitals:   04/03/18 0954  BP: (!) 136/94  Pulse: 68  Weight: 226 lb (102.5 kg)  Height: 5\' 10"  (1.778 m)    GEN-  The patient is well appearing, alert and oriented x 3 today.   Head- normocephalic, atraumatic Eyes-  Sclera clear, conjunctiva pink Ears- hearing intact Oropharynx- clear Neck- supple Lungs- Clear to ausculation bilaterally, normal work of breathing Heart- Regular rate and rhythm, no murmurs, rubs or gallops  GI- soft, NT, ND, + BS Extremities- no clubbing, cyanosis, or edema MS- no significant deformity or atrophy Skin- no rash or lesion Psych- euthymic mood, full affect   Labs:   Lab Results  Component Value Date   WBC 8.6 09/01/2017   HGB 16.6 09/01/2017   HCT 48.3 09/01/2017   MCV 84.3 09/01/2017   PLT 239 09/01/2017   No results for input(s): NA, K, CL, CO2, BUN, CREATININE, CALCIUM, PROT, BILITOT, ALKPHOS, ALT, AST, GLUCOSE in the last 168 hours.  Invalid input(s): LABALBU   Radiology/Studies: No results found.  12-lead ECG *sinus at 68 Interval 14/09/41 LVH with repolarization inferior wall MI  )    Assessment and Plan:  1. Pontine stroke  2 hyperlipidemia  Coronary artery disease with prior bypass  Hypertension    The patient's stroke was pontine.  Reviewing the neurological evaluation, it was thought to be small vessel disease.  This would not constitute a cryptogenic stroke and his there is no indication for monitoring.  We reviewed extensively his dyslipidemia.  He has been intolerant of statins.  We will try him on Crestor 3 days a week.  I will have him follow-up with Dr. Kirke Corin to discuss PCSK9 inhibitors as well as the new anti-hypertriglyceridemia drugs  Blood pressure is reasonably controlled.  We are glad to see him again as needed  Sherryl Manges, MD 04/03/2018 10:04 AM

## 2018-04-03 NOTE — Patient Instructions (Signed)
Medication Instructions:  Your physician recommends that you continue on your current medications as directed. Please refer to the Current Medication list given to you today.   Labwork: none  Testing/Procedures: none  Follow-Up: Your physician recommends that you schedule a follow-up appointment in: Dr Kirke CorinArida at next available.    If you need a refill on your cardiac medications before your next appointment, please call your pharmacy.

## 2018-04-05 LAB — HM DIABETES EYE EXAM

## 2018-04-11 ENCOUNTER — Other Ambulatory Visit: Payer: BLUE CROSS/BLUE SHIELD

## 2018-04-12 LAB — HEMOGLOBIN A1C
EAG (MMOL/L): 9 (calc)
HEMOGLOBIN A1C: 7.3 %{Hb} — AB (ref <5.7)
MEAN PLASMA GLUCOSE: 163 (calc)

## 2018-04-15 ENCOUNTER — Ambulatory Visit: Payer: BLUE CROSS/BLUE SHIELD | Admitting: Family Medicine

## 2018-04-15 ENCOUNTER — Encounter: Payer: Self-pay | Admitting: Family Medicine

## 2018-04-15 VITALS — BP 134/78 | HR 68 | Temp 98.4°F | Resp 16 | Ht 70.0 in | Wt 227.0 lb

## 2018-04-15 DIAGNOSIS — E785 Hyperlipidemia, unspecified: Secondary | ICD-10-CM

## 2018-04-15 DIAGNOSIS — Z23 Encounter for immunization: Secondary | ICD-10-CM

## 2018-04-15 DIAGNOSIS — Z1211 Encounter for screening for malignant neoplasm of colon: Secondary | ICD-10-CM

## 2018-04-15 DIAGNOSIS — E1169 Type 2 diabetes mellitus with other specified complication: Secondary | ICD-10-CM | POA: Diagnosis not present

## 2018-04-15 DIAGNOSIS — I10 Essential (primary) hypertension: Secondary | ICD-10-CM

## 2018-04-15 MED ORDER — METFORMIN HCL ER 500 MG PO TB24: 500.0000 mg | ORAL_TABLET | Freq: Every day | ORAL | 1 refills | Status: DC

## 2018-04-15 NOTE — Assessment & Plan Note (Addendum)
New diagnosis of Type 2 Diabetes now confirmed with repeated elevated A1c >6.5, most recent 7.3, shows his diabetes would currently be considered controlled. He still has evidence of hyperglycemia. - Without Hypoglycemia. Complications - other including hyperlipidemia and hypertriglyceridemia, GERD, obesity, vascular disease with history of CAD, MI, CVA - increases risk of future cardiovascular complications   Plan:  1. Discussion on new dx DM, treatment, complications - [facility] education/nutrition - Start new medicine Metformin XR 500mg  daily - given erratic eating schedule - will start with low dose, may increase to 1000mg  XR in future x 2 pills if needed 2. Encourage improved lifestyle - low carb, low sugar diet, reduce portion size, start regular exercise 3. Check CBG, bring log to next visit for review 4. Continue ASA, ARB, Future may reconsider Statin advised again 5. DM Foot exam done today / Advised to schedule DM ophtho exam, send record - he will check with [facility] and determine if they can add this and fax us report or next time 6. Follow-up 3 months - A1c - reviewed future med with GLP1 to consider as next option or in future.

## 2018-04-15 NOTE — Assessment & Plan Note (Signed)
Mild elevated but manual re-check BP improved. - Home BP readings detailed report readings - AM higher avg and PM controlled it seems - still some abnormal readings Complicated with history of CAD, s/p MI, CVA    Plan:  1. Still continue current meds - HALF dose Atenolol for 12.5mg  daily - caution with full dose causing more fatigue. Continue Losartan 50mg  daily - may increase in future if not at goal with lifestyle changes and treatment of diabetes 2. Encourage improved lifestyle - low sodium diet, regular exercise 3. Continue monitor BP outside office, bring readings to next visit, if persistently >135/85 or new symptoms notify office sooner 4. Follow-up with Cardiology for BP as well, and return 3 months for A1c review BP meds

## 2018-04-15 NOTE — Assessment & Plan Note (Signed)
Previously elevated Now new dx T2DM Last lipid panel 12/2017 Known ASCVD risk CAD, CVA Failed Atorvastatin 40 and Rosuvastatin 40mg  - myalgias  Plan Remain off statin for now - advise return to Cardiology to discuss options further - I would recommend lower dose Rosuvastatin 20mg  or less - may need intermittent dosing in future. Recommend statin for diabetic patient with ASCVD history - Continue other therapy ASA + Plavix - Discuss med options with Cardiology  Encourage improved lifestyle - low carb/cholesterol, reduce portion size, continue improving regular exercise Follow-up lipids q 6-12 months

## 2018-04-15 NOTE — Addendum Note (Signed)
Addended by: Elvina MattesPATEL, NISHABEN D on: 04/15/2018 03:40 PM   Modules accepted: Orders

## 2018-04-15 NOTE — Progress Notes (Addendum)
Subjective:    Patient ID: [name] [name], male    DOB: 24-Dec-1960, 57 y.o.   MRN: [id number]  [name] KluverRicky [name] is a 57 y.o. male presenting on 04/15/2018 for Hypertension and Diabetes (new diagnosis)  Patient provides majority of history, also accompanied by his wife, Tamela OddiBetsy who provides additional history.  HPI   CHRONIC DM, Type 2- New Diagnosis Reports he has prior A1c results from 7.5 to 6.5 and now most recently 7.3, confirming new dx T2DM. He admits concern with reduced energy and overall feels sluggish and tired still and concerned about this. CBGs: Avg 200s, Low 195, High 290. Checks CBGs 1-2x daily Meds: Never on med Currently on ARB Lifestyle: - Diet (Improved diet, drinks less soda and alcohol now, reducing bread and carbs starches. 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 - Exercise (Increasing activity but no regular exercise quite yet, he plans to start walking more) - [facility] ([facility]) last visit yesterday he was unaware of dx diabetes, and he will check with them status to see if they can do Diabetic Eye Exam Denies hypoglycemia, polyuria, visual changes, numbness or tingling.  CHRONIC HTN: Reports checking BP at home some variable readings wrist cuff reads 130-160 SBP, and DBP 70-90 Current Meds - Losartan 50mg  daily, Atenolol 12.5mg  daily (HALF of 25mg  daily)   Reports good compliance, took meds today. Tolerating well, w/o complaints. Denies CP, dyspnea, HA, edema, dizziness / lightheadedness  HYPERLIPIDEMIA / History of CVA / MI [facility] - he has been on statin in past but unable to tolerate, was taking Atorvastatin 40mg  daily, and then tried Rosuvastatin 40mg , he will return to discuss this with cardiology currently off statin and doing better with less myalgia -  Taking ASA 81, Plavix 75, Omega 3 krill oil  Health Maintenance:  Due for pneumonia vaccine (initial) < age 57 - Pneumovax-23 as a diabetic patient.   [facility] 04/15/2018 01/02/2018 10/04/2017  Decreased Interest 0 0 0  Down, Depressed, Hopeless 0 0 0  PHQ - 2 Score 0 0 0    Social History   Tobacco Use  . Smoking status: Former Smoker    Last attempt to quit: 05/19/1999    Years since quitting: 18.9  . Smokeless tobacco: Former Engineer, waterUser  Substance Use Topics  . Alcohol use: Yes    Comment: 8-10 oz, twice weekly  . Drug use: No    Comment: last in 1980's    Review of Systems Per HPI unless specifically indicated above     Objective:    BP 134/78 (BP Location: Right Arm, Cuff Size: Normal)   Pulse 68   Temp 98.4 F (36.9 C) (Oral)   Resp 16   Ht 5\' 10"  (1.778 m)   Wt 227 lb (103 kg)   BMI 32.57 kg/m   Wt Readings from Last 3 Encounters:  04/15/18 227 lb (103 kg)  04/03/18 226 lb (102.5 kg)  02/06/18 224 lb (101.6 kg)    Physical Exam  Constitutional: He is oriented to person, place, and time. He appears well-developed and well-nourished. No distress.  Well-appearing, comfortable, cooperative, obese  HENT:  Head: Normocephalic and atraumatic.  Mouth/Throat: Oropharynx is clear and moist.  Eyes: Conjunctivae are normal. Right eye exhibits no discharge. Left eye exhibits no discharge.  Neck:  Normal range of motion. Neck supple. No thyromegaly present.  Cardiovascular: Normal rate, regular rhythm, normal heart sounds and intact distal pulses.  No murmur heard. Pulmonary/Chest: Effort normal and breath sounds normal. No respiratory distress. He has no wheezes. He has no rales.  Musculoskeletal: Normal range of motion. He exhibits no edema.  Lymphadenopathy:    He has no cervical adenopathy.  Neurological: He is alert and oriented to person, place, and time.  Skin: Skin is warm and dry. No rash noted. He is not diaphoretic. No erythema.  Psychiatric: He  has a normal mood and affect. His behavior is normal.  Well groomed, good eye contact, normal speech and thoughts  Nursing note and vitals reviewed.    Diabetic Foot Exam - Simple   Simple Foot Form Diabetic Foot exam was performed with the following findings:  Yes 04/15/2018  9:08 AM  Visual Inspection No deformities, no ulcerations, no other skin breakdown bilaterally:  Yes Sensation Testing Intact to touch and monofilament testing bilaterally:  Yes Pulse Check Posterior Tibialis and Dorsalis pulse intact bilaterally:  Yes Comments     Results for orders placed or performed in visit on 03/27/18  Hemoglobin A1c  Result Value Ref Range   Hgb A1c MFr Bld 7.3 (H) <5.7 % of total Hgb   Mean Plasma Glucose 163 (calc)   eAG (mmol/L) 9.0 (calc)      Assessment & Plan:   Problem List Items Addressed This Visit    Essential hypertension    Mild elevated but manual re-check BP improved. - Home BP readings detailed report readings - AM higher avg and PM controlled it seems - still some abnormal readings Complicated with history of CAD, s/p MI, CVA    Plan:  1. Still continue current meds - HALF dose Atenolol for 12.5mg  daily - caution with full dose causing more fatigue. Continue Losartan 50mg  daily - may increase in future if not at goal with lifestyle changes and treatment of diabetes 2. Encourage improved lifestyle - low sodium diet, regular exercise 3. Continue monitor BP outside office, bring readings to next visit, if persistently >135/85 or new symptoms notify office sooner 4. Follow-up with Cardiology for BP as well, and return 3 months for A1c review BP meds      Hyperlipidemia associated with type 2 diabetes mellitus (HCC)    Previously elevated Now new dx T2DM Last lipid panel 12/2017 Known ASCVD risk CAD, CVA Failed Atorvastatin 40 and Rosuvastatin 40mg  - myalgias  Plan Remain off statin for now - advise return to Cardiology to discuss options further - I would  recommend lower dose Rosuvastatin 20mg  or less - may need intermittent dosing in future. Recommend statin for diabetic patient with ASCVD history - Continue other therapy ASA + Plavix - Discuss med options with Cardiology  Encourage improved lifestyle - low carb/cholesterol, reduce portion size, continue improving regular exercise Follow-up lipids q 6-12 months      Relevant Medications   metFORMIN (GLUCOPHAGE-XR) 500 MG 24 hr tablet   Type 2 diabetes mellitus with other specified complication (HCC) - Primary    New diagnosis of Type 2 Diabetes now confirmed with repeated elevated A1c >6.5, most recent 7.3, shows his diabetes would currently be considered controlled. He still has evidence of hyperglycemia. - Without Hypoglycemia. Complications - other including hyperlipidemia and hypertriglyceridemia, GERD, obesity, vascular disease with history of CAD, MI, CVA - increases risk of future cardiovascular complications   Plan:  1. Discussion on new dx DM, treatment,  complications - He declined referral to [facility] DM education/nutrition - Start new medicine Metformin XR 500mg  daily - given erratic eating schedule - will start with low dose, may increase to 1000mg  XR in future x 2 pills if needed 2. Encourage improved lifestyle - low carb, low sugar diet, reduce portion size, start regular exercise 3. Check CBG, bring log to next visit for review 4. Continue ASA, ARB, Future may reconsider Statin advised again 5. DM Foot exam done today / Advised to schedule DM ophtho exam, send record - he will check with [facility] and determine if they can add this and fax [place] report or next time 6. Follow-up 3 months - A1c - reviewed future med with GLP1 to consider as next option or in future.       Relevant Medications   metFORMIN (GLUCOPHAGE-XR) 500 MG 24 hr tablet    Other Visit Diagnoses    Screening for colon cancer     Due for routine colon cancer screening. Never had  colonoscopy (not interested), no family history colon cancer. - Discussion today about recommendations for either Colonoscopy or Cologuard screening, benefits and risks of screening, interested in Cologuard, understands that if positive then recommendation is for diagnostic colonoscopy to follow-up. - Ordered Cologuard today - previously ordered and he never received 12/2017    Relevant Orders   Cologuard       Meds ordered this encounter  Medications  . metFORMIN (GLUCOPHAGE-XR) 500 MG 24 hr tablet    Sig: Take 1 tablet (500 mg total) by mouth daily with lunch.    Dispense:  90 tablet    Refill:  1    Follow up plan: Return in about 3 months (around 07/16/2018) for DM A1c, med adjust.  Of note - the Pneumovax-23 was not given during office visit today. He will return later with his wife for her appointment and may be able to get it then or he can re-schedule for nurse visit.  Saralyn Pilar, DO [facility] 04/15/2018, 1:30 PM

## 2018-04-15 NOTE — Patient Instructions (Addendum)
Thank you for coming to the office today.  Discussion today on Type 2 Diabetes - A1c 7.3 - Start Metformin XR 24 hr pill 500mg  daily with food (lunch) - may cause upset stomach gradually improves over time.  Ask Dr [name] [name] about adjusting Statin dose - possibly Rosuvastatin 20mg  - reduced dose for future  If persistent elevated BP at home - may take whole pill Atenolol 25mg  - let me know otherwise  Eventually we need a copy of the Eye Doctor report for "Diabetic Eye Exam" - either can re-schedule or re-test and fax us result  Eat at least 3 meals and 1-2 snacks per day (don't skip breakfast).  Aim for no more than 5 hours between eating. - Tip: If you go >5 hours without eating and become very hungry, your body will supply it's own resources temporarily and you can gain extra weight when you eat.  The 5 Minute Rule of Exercise - Promise yourself to at least do 5 minutes of exercise (make sure you time it), and if at the end of 5 minutes (this is the hardest part of the work-out), if you still feel like you want stop (or not motivated to continue) then allow yourself to stop. Otherwise, more often than not you will feel encouraged that you can continue for a little while longer or even more!  Diet Recommendations for Diabetes   Starchy (carb) foods include: Bread, rice, pasta, potatoes, corn, crackers, bagels, muffins, all baked goods.   Protein foods include: Meat, fish, poultry, eggs, dairy foods, and beans such as pinto and kidney beans (beans also provide carbohydrate).   1. Eat at least 3 meals and 1-2 snacks per day. Never go more than 4-5 hours while awake without eating.   2. Limit starchy foods to TWO per meal and ONE per snack. ONE portion of a starchy  food is equal to the following:   - ONE slice of bread (or its equivalent, such as half of a hamburger bun).   - 1/2 cup of a "scoopable" starchy food such as potatoes or rice.   - 1 OUNCE (28 grams) of starchy snacks (crackers or  pretzels, look on label).   - 15 grams of carbohydrate as shown on food label.   3. Both lunch and dinner should include a protein food, a carb food, and vegetables.   - Obtain twice as many veg's as protein or carbohydrate foods for both lunch and dinner.   - Try to keep frozen veg's on hand for a quick vegetable serving.     - Fresh or frozen veg's are best.   4. Breakfast should always include protein.    -------------------------------------------  Re ordered Cologuard   Please schedule a Follow-up Appointment to: Return in about 3 months (around 07/16/2018) for DM A1c, med adjust.  If you have any other questions or concerns, please feel free to call the office or send a message through MyChart. You may also schedule an earlier appointment if necessary.  Additionally, you may be receiving a survey about your experience at our office within a few days to 1 week by e-mail or mail. We value your feedback.  [name] PilarAlexander Janeil Schexnayder, DO [facility]

## 2018-04-18 ENCOUNTER — Encounter: Payer: Self-pay | Admitting: Family Medicine

## 2018-04-18 ENCOUNTER — Encounter (INDEPENDENT_AMBULATORY_CARE_PROVIDER_SITE_OTHER): Payer: Self-pay

## 2018-05-20 ENCOUNTER — Ambulatory Visit: Payer: BLUE CROSS/BLUE SHIELD | Admitting: Cardiovascular Disease

## 2018-06-18 ENCOUNTER — Other Ambulatory Visit: Payer: Self-pay | Admitting: Family Medicine

## 2018-06-18 DIAGNOSIS — I10 Essential (primary) hypertension: Secondary | ICD-10-CM

## 2018-06-20 ENCOUNTER — Telehealth: Payer: Self-pay | Admitting: Family Medicine

## 2018-06-20 NOTE — Telephone Encounter (Signed)
Please contact patient to notify him that we have received a fax notification from Cologuard says that he has not completed / sent back his Cologuard kit.  He should contact [place] if he has any problems with the kit and they can assist him further.  24/7 telephone support at [phone].  Nobie Putnam, [facility] 06/20/2018, 6:04 PM

## 2018-06-23 NOTE — Telephone Encounter (Signed)
Patient's spouse notified hasn't get chance to do it but will do sooner.

## 2018-07-01 LAB — COLOGUARD: COLOGUARD: NEGATIVE

## 2018-07-15 ENCOUNTER — Other Ambulatory Visit: Payer: Self-pay | Admitting: Family Medicine

## 2018-07-15 ENCOUNTER — Other Ambulatory Visit: Payer: Self-pay | Admitting: Nurse Practitioner

## 2018-07-15 DIAGNOSIS — I10 Essential (primary) hypertension: Secondary | ICD-10-CM

## 2018-07-18 ENCOUNTER — Other Ambulatory Visit: Payer: Self-pay

## 2018-07-22 ENCOUNTER — Ambulatory Visit
Admission: RE | Admit: 2018-07-22 | Discharge: 2018-07-22 | Disposition: A | Discharge status: Home / Self Care | Payer: BLUE CROSS/BLUE SHIELD | Source: Ambulatory Visit | Attending: Family Medicine | Admitting: Family Medicine

## 2018-07-22 ENCOUNTER — Encounter: Payer: Self-pay | Admitting: Family Medicine

## 2018-07-22 ENCOUNTER — Ambulatory Visit (INDEPENDENT_AMBULATORY_CARE_PROVIDER_SITE_OTHER): Payer: BLUE CROSS/BLUE SHIELD | Admitting: Family Medicine

## 2018-07-22 VITALS — BP 138/80 | HR 65 | Temp 98.8°F | Resp 16 | Ht 70.0 in | Wt 225.0 lb

## 2018-07-22 DIAGNOSIS — E1169 Type 2 diabetes mellitus with other specified complication: Secondary | ICD-10-CM

## 2018-07-22 DIAGNOSIS — M65331 Trigger finger, right middle finger: Secondary | ICD-10-CM | POA: Insufficient documentation

## 2018-07-22 DIAGNOSIS — I6930 Unspecified sequelae of cerebral infarction: Secondary | ICD-10-CM

## 2018-07-22 DIAGNOSIS — Z23 Encounter for immunization: Secondary | ICD-10-CM | POA: Diagnosis not present

## 2018-07-22 DIAGNOSIS — E669 Obesity, unspecified: Secondary | ICD-10-CM | POA: Insufficient documentation

## 2018-07-22 DIAGNOSIS — M170 Bilateral primary osteoarthritis of knee: Secondary | ICD-10-CM | POA: Diagnosis present

## 2018-07-22 DIAGNOSIS — M1811 Unilateral primary osteoarthritis of first carpometacarpal joint, right hand: Secondary | ICD-10-CM

## 2018-07-22 DIAGNOSIS — I10 Essential (primary) hypertension: Secondary | ICD-10-CM

## 2018-07-22 LAB — POCT GLYCOSYLATED HEMOGLOBIN (HGB A1C): HEMOGLOBIN A1C: 6.6 % — AB (ref 4.0–5.6)

## 2018-07-22 MED ORDER — SEMAGLUTIDE(0.25 OR 0.5MG/DOS) 2 MG/1.5ML ~~LOC~~ SOPN: 0.2500 mg | PEN_INJECTOR | SUBCUTANEOUS | 0 refills | Status: DC

## 2018-07-22 NOTE — Patient Instructions (Addendum)
Thank you for coming to the office today.  Cologuard is NEGATIVE - unlikely to have colon cancer. Next test due again in 3 years.  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 In the future you can repeat the same everyday Tylenol course for 1-2 weeks at a time.   In future can consider Rx Meloxicam or Naproxen - would take either one regularly for 1-2 week during flare up of pain or even brief course, then can stay off for while and use as needed in future.  Use RICE therapy: - R - Rest / relative rest with activity modification avoid overuse and frequent bending or pressure on bent knee - I - Ice packs (make sure you use a towel or sock / something to protect skin) - C - Compression with flexible Knee Sleeve to apply pressure and reduce swelling allowing more support, which I think is part of your problem - E - Elevation - if significant swelling, lift leg above heart level (toes above your nose) to help reduce swelling, most helpful at night after day of being on your feet  Next option is Physical Therapy - for evaluation and treatment of muscles to increase strength of surrounding muscles.  Future we can consider Orthopedics for R trigger finger.  A1c 6.6 - great job  START Ozempic 0.25mg  weekly for 4 weeks then increase to 0.5mg  weekly until you return - only x 2 doses until sample is out, CALL OFFICE REQUEST new rx sent - use copay card to reduce copay.  Monitor BP 130 to 140s - if higher then may consider new medication change for now just keep  track of it   Please schedule a Follow-up Appointment to: Return in about 4 months (around 11/20/2018) for DM A1c, med adjust, HTN, Knee Arthritis.  If you have any other questions or concerns, please feel free to call the office or send a message through MyChart. You may also schedule an earlier appointment if necessary.  Additionally, you may be receiving a survey about your experience at our office within a few days to 1 week by e-mail or mail. We value your feedback.  Saralyn Pilar, DO [facility], [place]

## 2018-07-22 NOTE — Progress Notes (Signed)
Subjective:    Patient ID: [name] [name], male    DOB: [birth year], 57 y.o.   MRN: [id number]  [name] [name] is a 57 y.o. male presenting on 07/22/2018 for Diabetes  Patient accompanied by wife, [name], today with additional history.  HPI   CHRONIC DM, Type 2 Prior history A1c 6 to 7 approx.  Today A1c 6.6 CBGs: Avg 160s, Low 154, High >200. Checks CBGs 1-2x most days Meds: Metformin XR 500mg  Currently on ARB Lifestyle: - Diet (Improved diet, drinks less soda and alcohol now, reducing bread and carbs starches. 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 - Exercise (Increasing activity but no regular exercise quite yet, he plans to start walking more) Denies hypoglycemia, polyuria, visual changes, numbness or tingling.  CHRONIC HTN: Reports checking BP at home some variable readings wrist cuff, seems to be avg 130-140 Current Meds - Losartan 50mg  daily, Atenolol 25mg  daily Reports good compliance, took meds today. Tolerating well, w/o complaints. Denies CP, dyspnea, HA, edema, dizziness / lightheadedness  Bilateral Knee Joint Pain / Muscle Weakness - post CVA Reports he describes chronic issue over few years now some gradual worse with bilateral knee pain, both are same, usually describes some stiffness and ache and sore if more sedentary and then when becomes active again will cause flare up in symptoms. He has tried tylenol 1000-1500mg  one dose occasionally without significant results, stopped taking this. - Fam history of arthritis, knee especially - He has prior diagnosis of OA/DJD for knee pain in past 2 years from prior PCP in [place] - was on Meloxicam with improvement - Also R middle trigger finger with some discomfort and stiffness, occasional episode of finger gets stuck and needs to pop back to  position. No prior X-ray Denies injury, trauma   Health Maintenance:  Recent updates - Colon CA screening - cologuard negative 07/01/18  Due for Flu Shot, will receive today    Depression screen [facility] 2/9 04/15/2018 01/02/2018 10/04/2017  Decreased Interest 0 0 0  Down, Depressed, Hopeless 0 0 0  PHQ - 2 Score 0 0 0    Social History   Tobacco Use  . Smoking status: Former Smoker    Last attempt to quit: 05/19/1999    Years since quitting: 19.1  . Smokeless tobacco: Former Engineer, water Use Topics  . Alcohol use: Yes    Comment: 8-10 oz, twice weekly  . Drug use: No    Comment: last in 1980's    Review of Systems Per HPI unless specifically indicated above     Objective:    BP 138/80 (BP Location: Left Arm, Cuff Size: Normal)   Pulse 65   Temp 98.8 F (37.1 C) (Oral)   Resp 16   Ht 5\' 10"  (1.778 m)   Wt 225 lb (102.1 kg)   BMI 32.28 kg/m   Wt Readings from Last 3 Encounters:  07/22/18 225 lb (102.1 kg)  04/15/18 227 lb (103 kg)  04/03/18 226 lb (102.5 kg)    Physical Exam  Constitutional: He is oriented to person, place, and time. He appears well-developed and well-nourished. No distress.  Well-appearing, comfortable, cooperative, obese w/ some wt loss  HENT:  Head: Normocephalic and atraumatic.  Mouth/Throat: Oropharynx is clear and moist.  Eyes: Conjunctivae are normal. Right eye exhibits no discharge. Left eye  exhibits no discharge.  Neck: Normal range of motion. Neck supple. No thyromegaly present.  Cardiovascular: Normal rate, regular rhythm, normal heart sounds and intact distal pulses.  No murmur heard. Pulmonary/Chest: Effort normal and breath sounds normal. No respiratory distress. He has no wheezes. He has no rales.  Musculoskeletal: He exhibits no edema.  Bilateral Knees Inspection: Slightly bulky appearance and symmetrical. No ecchymosis or effusion. Palpation: Non-tender joint line. Significant crepitus bilateral. ROM: Full active ROM  bilaterally Special Testing: Lachman / Valgus/Varus tests negative with intact ligaments (ACL, MCL, LCL) Strength: 5/5 intact knee flex/ext, ankle dorsi/plantarflex Neurovascular: distally intact sensation light touch and pulses  R Hand Inspection: slightly bulky R thumb base CMC. Palpation: non tender on exam over CMC, other fingers, snuff box or hand carpals ROM: slightly reduced thumb ROM opposition, reduced middle finger flexion DIP, no active triggering today Strength: 5/5 intact Neurovascular: intact   Lymphadenopathy:    He has no cervical adenopathy.  Neurological: He is alert and oriented to person, place, and time.  Skin: Skin is warm and dry. No rash noted. He is not diaphoretic. No erythema.  Psychiatric: He has a normal mood and affect. His behavior is normal.  Well groomed, good eye contact, normal speech and thoughts  Nursing note and vitals reviewed.    I have personally reviewed the radiology report from Bilateral Knee X-ray / R Hand X-ray on 07/22/18.  CLINICAL DATA: Bilateral knee pain  EXAM: LEFT KNEE - COMPLETE 4+ VIEW; RIGHT KNEE - COMPLETE 4+ VIEW  COMPARISON: None.  FINDINGS: Mild bilateral knee joint degenerative changes with early joint space narrowing and spurring in the medial compartments most notably and left greater than right. No acute bony abnormality, osteochondral lesion, erosions or chondrocalcinosis.  No definite joint effusions.  IMPRESSION: Mild bilateral knee joint degenerative changes most notable in the medial compartments and slightly greater on the left than the right.  No acute bony findings or joint effusion.   Electronically Signed By: Rudie Meyer M.D. On: 07/22/2018 14:27  --------------------------------------------   CLINICAL DATA:  Progressive right hand pain. Pain at the thumb and middle finger.  EXAM: RIGHT HAND - COMPLETE 3+ VIEW  COMPARISON:  None  FINDINGS: Osteoarthritic degenerative  changes are most evident at the first [facility] joint and at the DIP joint of the middle finger. There is slight lateral subluxation of the first CMC joint. More mild degenerative changes are noted at the DIP joint in the small finger. No acute abnormalities are present.  IMPRESSION: 1. Osteoarthritic degenerative changes are most evident at the first [facility] joint and DIP joint of the long finger.   Electronically Signed   By: Marin Roberts M.D.   On: 07/22/2018 14:27    Results for orders placed or performed in visit on 07/22/18  POCT HgB A1C  Result Value Ref Range   Hemoglobin A1C 6.6 (A) 4.0 - 5.6 %   Recent Labs    12/26/17 0802 04/11/18 0818 07/22/18 0830  HGBA1C 6.5* 7.3* 6.6*      Assessment & Plan:   Problem List Items Addressed This Visit    Degenerative arthritis of thumb, right    Stable chronic problem OA/DJD, secondary to chronic use of R hand Confirmed on X-ray today Tylenol regularly, future NSAID oral vs future diclofenac topical      Relevant Orders   DG Hand Complete Right (Completed)   Essential hypertension    Mild elevated but manual re-check BP improved. - Home BP readings detailed report  readings - AM higher avg and PM controlled it seems - still some abnormal readings Complicated with history of CAD, s/p MI, CVA    Plan:  1. Continue Losartan 50mg  daily, Atenolol 25mg  2. Encourage improved lifestyle - low sodium diet, regular exercise 3. Continue monitor BP outside office, bring readings to next visit, if persistently >135/85 or new symptoms notify office sooner 4. Follow-up with Cardiology for BP as well      History of cerebrovascular accident (CVA) with residual deficit    Possibly contributing to muscle weakness still - chronic residual, gradual improving though. - Emphasis on improving muscle strength to help knee function      Obesity (BMI 30.0-34.9)    Gradual wt loss improving diet Start GLP1 now also for wt loss       Trigger finger, right middle finger    Likely triggering 2ndary to OA/DJD X-ray today - confirms DIP OA Treat conservatively w/ tylenol supportive care Follow-up in future may need ortho      Relevant Orders   DG Hand Complete Right (Completed)   Type 2 diabetes mellitus with other specified complication (HCC) - Primary    Improved DM control A1c to 6.6 Hyperglycemia still present Without Hypoglycemia. Complications - other including hyperlipidemia and hypertriglyceridemia, GERD, obesity, vascular disease with history of CAD, MI, CVA - increases risk of future cardiovascular complications   Plan:  1. Discontinue Metformin XR for now due to GI intolerance - start GLP1 Ozempic 0.25mg  weekly inj - x 4 week, then up to 0.5 mg weekly - sample given for 6 weeks, demo, review benefit risks, side effect 2. Encourage improved lifestyle - low carb, low sugar diet, reduce portion size, start regular exercise 3. Check CBG, bring log to next visit for review 4. Continue ASA, ARB, Future may reconsider Statin advised again 5. Follow-up 4 months - A1c       Relevant Medications   Semaglutide,0.25 or 0.5MG /DOS, (OZEMPIC, 0.25 OR 0.5 MG/DOSE,) 2 MG/1.5ML SOPN   Other Relevant Orders   POCT HgB A1C (Completed)    Other Visit Diagnoses    Bilateral primary osteoarthritis of knee       Relevant Orders   DG Knee Complete 4 Views Left (Completed)   DG Knee Complete 4 Views Right (Completed)   Needs flu shot       Relevant Orders   Flu Vaccine QUAD 36+ mos IM (Completed)      Meds ordered this encounter  Medications  . Semaglutide,0.25 or 0.5MG /DOS, (OZEMPIC, 0.25 OR 0.5 MG/DOSE,) 2 MG/1.5ML SOPN    Sig: Inject 0.25 mg into the skin once a week. For first 4 weeks. Then increase dose to 0.5mg  weekly    Dispense:  1 pen    Refill:  0   Orders Placed This Encounter  Procedures  . DG Knee Complete 4 Views Left    Standing Status:   Future    Number of Occurrences:   1    Standing Expiration  Date:   09/22/2019    Order Specific Question:   Reason for Exam (SYMPTOM  OR DIAGNOSIS REQUIRED)    Answer:   chronic bilateral knee pain and stiffness, history of arthritis, no prior imaging, no injury    Order Specific Question:   Preferred imaging location?    Answer:   [facility]    Order Specific Question:   Radiology Contrast Protocol - do NOT remove file path    Answer:   \\[email or web address]s.pdf  . DG  Knee Complete 4 Views Right    Standing Status:   Future    Number of Occurrences:   1    Standing Expiration Date:   09/22/2019    Order Specific Question:   Reason for Exam (SYMPTOM  OR DIAGNOSIS REQUIRED)    Answer:   chronic bilateral knee pain and stiffness, history of arthritis, no injury    Order Specific Question:   Preferred imaging location?    Answer:   [facility]    Order Specific Question:   Radiology Contrast Protocol - do NOT remove file path    Answer:   \\[email or web address]s.pdf  . DG Hand Complete Right    Standing Status:   Future    Number of Occurrences:   1    Standing Expiration Date:   09/22/2019    Order Specific Question:   Reason for Exam (SYMPTOM  OR DIAGNOSIS REQUIRED)    Answer:   chronic right hand middle finger and thumb pain and stiffness, trigger middle finger    Order Specific Question:   Preferred imaging location?    Answer:   [facility]    Order Specific Question:   Radiology Contrast Protocol - do NOT remove file path    Answer:   \\[email or web address]s.pdf  . Flu Vaccine QUAD 36+ mos IM  . POCT HgB A1C    Follow up plan: Return in about 4 months (around 11/20/2018) for DM A1c, med adjust, HTN, Knee Arthritis.   Saralyn Pilar, DO [facility] 07/22/2018, 5:54 PM

## 2018-07-23 NOTE — Assessment & Plan Note (Signed)
Stable chronic problem OA/DJD, secondary to chronic use of R hand Confirmed on X-ray today Tylenol regularly, future NSAID oral vs future diclofenac topical

## 2018-07-23 NOTE — Assessment & Plan Note (Signed)
Likely triggering 2ndary to OA/DJD X-ray today - confirms DIP OA Treat conservatively w/ tylenol supportive care Follow-up in future may need ortho

## 2018-07-23 NOTE — Assessment & Plan Note (Signed)
Improved DM control A1c to 6.6 Hyperglycemia still present Without Hypoglycemia. Complications - other including hyperlipidemia and hypertriglyceridemia, GERD, obesity, vascular disease with history of CAD, MI, CVA - increases risk of future cardiovascular complications   Plan:  1. Discontinue Metformin XR for now due to GI intolerance - start GLP1 Ozempic 0.25mg  weekly inj - x 4 week, then up to 0.5 mg weekly - sample given for 6 weeks, demo, review benefit risks, side effect 2. Encourage improved lifestyle - low carb, low sugar diet, reduce portion size, start regular exercise 3. Check CBG, bring log to next visit for review 4. Continue ASA, ARB, Future may reconsider Statin advised again 5. Follow-up 4 months - A1c

## 2018-07-23 NOTE — Assessment & Plan Note (Signed)
Gradual wt loss improving diet Start GLP1 now also for wt loss

## 2018-07-23 NOTE — Assessment & Plan Note (Signed)
Possibly contributing to muscle weakness still - chronic residual, gradual improving though. - Emphasis on improving muscle strength to help knee function

## 2018-07-23 NOTE — Assessment & Plan Note (Signed)
Mild elevated but manual re-check BP improved. - Home BP readings detailed report readings - AM higher avg and PM controlled it seems - still some abnormal readings Complicated with history of CAD, s/p MI, CVA    Plan:  1. Continue Losartan 50mg  daily, Atenolol 25mg  2. Encourage improved lifestyle - low sodium diet, regular exercise 3. Continue monitor BP outside office, bring readings to next visit, if persistently >135/85 or new symptoms notify office sooner 4. Follow-up with Cardiology for BP as well

## 2018-08-05 ENCOUNTER — Ambulatory Visit: Payer: BLUE CROSS/BLUE SHIELD | Admitting: Cardiovascular Disease

## 2018-08-05 ENCOUNTER — Encounter

## 2018-08-05 ENCOUNTER — Encounter: Payer: Self-pay | Admitting: Cardiovascular Disease

## 2018-08-05 VITALS — BP 140/96 | HR 84 | Ht 70.0 in | Wt 225.0 lb

## 2018-08-05 DIAGNOSIS — I2510 Atherosclerotic heart disease of native coronary artery without angina pectoris: Secondary | ICD-10-CM

## 2018-08-05 DIAGNOSIS — I10 Essential (primary) hypertension: Secondary | ICD-10-CM | POA: Diagnosis not present

## 2018-08-05 DIAGNOSIS — E785 Hyperlipidemia, unspecified: Secondary | ICD-10-CM | POA: Diagnosis not present

## 2018-08-05 MED ORDER — EZETIMIBE 10 MG PO TABS: 10.0000 mg | ORAL_TABLET | Freq: Every day | ORAL | 1 refills | Status: DC

## 2018-08-05 MED ORDER — ICOSAPENT ETHYL 1 G PO CAPS: 2.0000 g | ORAL_CAPSULE | Freq: Two times a day (BID) | ORAL | 11 refills | Status: DC

## 2018-08-05 NOTE — Patient Instructions (Addendum)
Medication Instructions:  STOP the Krill oil START Zetia 10 mg daily START Vascepa 2 g (2 pills) two times daily (samples given)  If you need a refill on your cardiac medications before your next appointment, please call your pharmacy.   Lab work: Your provider would like for you to return in 6 weeks to have the following labs drawn: FASTING Lipid and Liver. [facility] and check in at the front desk. You do not need an appointment.    Testing/Procedures: None ordered  Follow-Up: At [facility], you and your health needs are our priority.  As part of our continuing mission to provide you with exceptional heart care, we have created designated Provider Care Teams.  These Care Teams include your primary Cardiologist (physician) and Advanced Practice Providers (APPs -  Physician Assistants and Nurse Practitioners) who all work together to provide you with the care you need, when you need it. You will need a follow up appointment in 6 months.  Please call our office 2 months in advance to schedule this appointment.  You may see Dr. Kirke CorinArida or one of the following Advanced Practice Providers on your designated Care Team:   Nicolasa Duckinghristopher Berge, NP Eula Listenyan Dunn, PA-C . Marisue IvanJacquelyn Visser, PA-C

## 2018-08-05 NOTE — Progress Notes (Signed)
Cardiology Office Note   Date:  08/05/2018   ID:  [name] [name], DOB 05-09-1961, MRN [id number]  PCP:  Smitty Cords, DO  Cardiologist:   Lorine Bears, MD   Chief Complaint  Patient presents with  . Other    Patient denies chest pain and SOB at this time. Meds reviewed verbally with patient.       History of Present Illness: [name] [name] is a 57 y.o. male who presents for a follow-up visit regarding coronary artery disease.  He is status post myocardial infarction and stent placement to the right coronary artery in 2000.  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 He quit smoking after his myocardial infarction in 2000. He has been doing well with no recent chest pain, shortness of breath or palpitations.  He has tried multiple statins including atorvastatin, rosuvastatin and pravastatin with intolerance due to severe myalgia affecting both thighs.    Past Medical History:  Diagnosis Date  . Anxiety   . CAD (coronary artery disease)    s/p stenting  . GERD (gastroesophageal reflux disease)   . MI (myocardial infarction) (HCC) 2000    Past Surgical History:  Procedure Laterality Date  . CARDIAC CATHETERIZATION     [facility]  . CORONARY STENT PLACEMENT  2000   RCA, Stent s/p MI     Current Outpatient Medications  Medication Sig Dispense Refill  . aspirin EC 81 MG tablet Take 81 mg by mouth daily.    Marland Kitchen atenolol (TENORMIN) 25 MG tablet TAKE 1 TABLET BY MOUTH EVERY DAY 90 tablet 1  . Cholecalciferol (VITAMIN D3) 5000 units CAPS Take 1 capsule (5,000 Units total) by mouth daily. For 12 weeks, then start Vitamin D3 2,000 units daily (OTC) 30 capsule 2  . clopidogrel (PLAVIX) 75 MG tablet Take 1 tablet (75 mg total) by mouth daily. 90 tablet 3  . losartan (COZAAR) 50 MG tablet TAKE 1 TABLET BY MOUTH EVERY DAY 90 tablet 1  . MEGARED OMEGA-3 KRILL OIL 500 MG  CAPS Take by mouth.    . Multiple Vitamins-Minerals (MENS 50+ MULTI VITAMIN/MIN PO) Take by mouth daily.    . Semaglutide,0.25 or 0.5MG /DOS, (OZEMPIC, 0.25 OR 0.5 MG/DOSE,) 2 MG/1.5ML SOPN Inject 0.25 mg into the skin once a week. For first 4 weeks. Then increase dose to 0.5mg  weekly 1 pen 0   No current facility-administered medications for this visit.     Allergies:   Patient has no known allergies.    Social History:  The patient  reports that he quit smoking about 19 years ago. He has quit using smokeless tobacco. He reports that he drinks alcohol. He reports that he does not use drugs.   Family History:  The patient's family history includes Alcohol abuse in his father; Arthritis in his mother; Colitis in his unknown relative; Colon polyps in his father; Diabetes in his brother, father, mother, and sister; Heart disease in his father and unknown relative; Hyperlipidemia in his father and mother; Hypertension in his father and mother.    ROS:  Please see the history of present illness.   Otherwise, review of systems are positive for none.   All other systems are reviewed and negative.    PHYSICAL EXAM: VS:  BP (!) 140/96 (BP Location: Left Arm, Patient Position: Sitting, Cuff Size: Normal)   Pulse 84   Ht 5\' 10"  (1.778 m)   Wt  225 lb (102.1 kg)   BMI 32.28 kg/m  , BMI Body mass index is 32.28 kg/m. GEN: Well nourished, well developed, in no acute distress  HEENT: normal  Neck: no JVD, carotid bruits, or masses Cardiac: RRR; no murmurs, rubs, or gallops,no edema  Respiratory:  clear to auscultation bilaterally, normal work of breathing GI: soft, nontender, nondistended, + BS MS: no deformity or atrophy  Skin: warm and dry, no rash Neuro:  Strength and sensation are intact Psych: euthymic mood, full affect   EKG:  EKG is not ordered today.   Recent Labs: 08/31/2017: TSH 1.569 09/01/2017: Hemoglobin 16.6; Platelets 239 12/26/2017: ALT 45; BUN 17; Creat 0.84; Potassium 4.5;  Sodium 140    Lipid Panel    Component Value Date/Time   CHOL 183 12/26/2017 0802   TRIG 385 (H) 12/26/2017 0802   HDL 28 (L) 12/26/2017 0802   CHOLHDL 6.5 (H) 12/26/2017 0802   VLDL UNABLE TO CALCULATE IF TRIGLYCERIDE OVER 400 mg/dL 47/82/956212/15/2018 13081619   LDLCALC 104 (H) 12/26/2017 0802      Wt Readings from Last 3 Encounters:  08/05/18 225 lb (102.1 kg)  07/22/18 225 lb (102.1 kg)  04/15/18 227 lb (103 kg)      No flowsheet data found.    ASSESSMENT AND PLAN:  1.  Coronary artery disease involving native coronary arteries without angina: He is doing reasonably well overall with no anginal symptoms.  Continue medical therapy and risk factor modification.  2.  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  3.  Mixed hyperlipidemia: Intolerance to statins due to severe myalgia.  I reviewed most recent lipid profile in April which showed a triglyceride of 385 and an LDL of 104. I elected to start him on Vascepa 2 g twice daily and Zetia 10 mg once daily.  Recheck lipid and liver profile in 6 weeks.  We need to get his LDL below 70 and I might consider Livalo or PCSK9 inhibitors.  4.  Essential hypertension: Blood pressure is mildly elevated.  Currently on atenolol and losartan.  We can consider increasing losartan or switching atenolol to carvedilol down the road.    Disposition:   FU with me in 6 months  Signed,  Lorine BearsMuhammad Chastin Riesgo, MD  08/05/2018 4:03 PM    [facility]

## 2018-08-06 ENCOUNTER — Telehealth: Payer: Self-pay

## 2018-08-06 NOTE — Telephone Encounter (Signed)
Prior Authorization Completed thru Cover my meds. Key [id number] waiting for a response.

## 2018-09-11 ENCOUNTER — Telehealth: Payer: Self-pay | Admitting: Family Medicine

## 2018-09-11 ENCOUNTER — Other Ambulatory Visit: Payer: Self-pay

## 2018-09-11 DIAGNOSIS — E1169 Type 2 diabetes mellitus with other specified complication: Secondary | ICD-10-CM

## 2018-09-11 MED ORDER — SEMAGLUTIDE(0.25 OR 0.5MG/DOS) 2 MG/1.5ML ~~LOC~~ SOPN: 0.5000 mg | PEN_INJECTOR | SUBCUTANEOUS | 0 refills | Status: DC

## 2018-09-12 ENCOUNTER — Other Ambulatory Visit: Payer: Self-pay

## 2018-09-12 DIAGNOSIS — E1169 Type 2 diabetes mellitus with other specified complication: Secondary | ICD-10-CM

## 2018-09-12 MED ORDER — SEMAGLUTIDE(0.25 OR 0.5MG/DOS) 2 MG/1.5ML ~~LOC~~ SOPN: 0.5000 mg | PEN_INJECTOR | SUBCUTANEOUS | 0 refills | Status: DC

## 2018-09-13 ENCOUNTER — Other Ambulatory Visit: Payer: Self-pay | Admitting: Nurse Practitioner

## 2018-09-13 DIAGNOSIS — E1169 Type 2 diabetes mellitus with other specified complication: Secondary | ICD-10-CM

## 2018-09-15 ENCOUNTER — Other Ambulatory Visit: Payer: Self-pay

## 2018-09-15 DIAGNOSIS — E1169 Type 2 diabetes mellitus with other specified complication: Secondary | ICD-10-CM

## 2018-09-15 MED ORDER — SEMAGLUTIDE(0.25 OR 0.5MG/DOS) 2 MG/1.5ML ~~LOC~~ SOPN: 0.5000 mg | PEN_INJECTOR | SUBCUTANEOUS | 2 refills | Status: DC

## 2018-09-15 NOTE — Telephone Encounter (Signed)
Need refill 

## 2018-09-15 NOTE — Telephone Encounter (Signed)
Mychart message. The prescription is pending.

## 2018-09-30 NOTE — Telephone Encounter (Signed)
error 

## 2018-10-14 DIAGNOSIS — E1169 Type 2 diabetes mellitus with other specified complication: Secondary | ICD-10-CM

## 2018-10-20 NOTE — Addendum Note (Signed)
Addended by: Smitty Cords on: 10/20/2018 12:09 PM   Modules accepted: Orders

## 2018-10-21 MED ORDER — METFORMIN HCL ER 750 MG PO TB24: 750.0000 mg | ORAL_TABLET | Freq: Every day | ORAL | 1 refills | Status: DC

## 2018-10-21 NOTE — Addendum Note (Signed)
Addended by: Smitty Cords on: 10/21/2018 01:04 PM   Modules accepted: Orders

## 2018-11-12 ENCOUNTER — Other Ambulatory Visit: Payer: Self-pay | Admitting: Family Medicine

## 2018-11-12 DIAGNOSIS — I2510 Atherosclerotic heart disease of native coronary artery without angina pectoris: Secondary | ICD-10-CM

## 2018-11-12 DIAGNOSIS — I6930 Unspecified sequelae of cerebral infarction: Secondary | ICD-10-CM

## 2019-01-12 ENCOUNTER — Other Ambulatory Visit: Payer: Self-pay | Admitting: Family Medicine

## 2019-01-12 DIAGNOSIS — I10 Essential (primary) hypertension: Secondary | ICD-10-CM

## 2019-02-19 ENCOUNTER — Other Ambulatory Visit: Payer: Self-pay | Admitting: Cardiovascular Disease

## 2019-03-09 DIAGNOSIS — M25562 Pain in left knee: Secondary | ICD-10-CM

## 2019-03-09 DIAGNOSIS — G8929 Other chronic pain: Secondary | ICD-10-CM

## 2019-03-10 MED ORDER — MELOXICAM 15 MG PO TABS: 15.0000 mg | ORAL_TABLET | Freq: Every day | ORAL | 2 refills | Status: DC | PRN

## 2019-04-12 ENCOUNTER — Other Ambulatory Visit: Payer: Self-pay | Admitting: Family Medicine

## 2019-04-12 DIAGNOSIS — E1169 Type 2 diabetes mellitus with other specified complication: Secondary | ICD-10-CM

## 2019-07-11 ENCOUNTER — Other Ambulatory Visit: Payer: Self-pay | Admitting: Family Medicine

## 2019-07-11 DIAGNOSIS — I10 Essential (primary) hypertension: Secondary | ICD-10-CM

## 2019-07-14 ENCOUNTER — Other Ambulatory Visit: Payer: Self-pay | Admitting: Family Medicine

## 2019-07-14 DIAGNOSIS — M25562 Pain in left knee: Secondary | ICD-10-CM

## 2019-07-14 DIAGNOSIS — G8929 Other chronic pain: Secondary | ICD-10-CM

## 2019-08-28 ENCOUNTER — Other Ambulatory Visit: Payer: Self-pay | Admitting: Family Medicine

## 2019-08-28 DIAGNOSIS — I10 Essential (primary) hypertension: Secondary | ICD-10-CM

## 2019-08-28 NOTE — Telephone Encounter (Signed)
The pt is currently switching his care over to a different provider in a few weeks, but is completely out of his Atenolol. His wife states that their insurance will be out of network.

## 2019-08-28 NOTE — Telephone Encounter (Signed)
Patient has not been seen in over a year. Please schedule follow up visit and I will send him through enough medicine to get to that appointment.

## 2019-12-21 ENCOUNTER — Other Ambulatory Visit: Payer: Self-pay | Admitting: Family Medicine

## 2019-12-21 DIAGNOSIS — I6930 Unspecified sequelae of cerebral infarction: Secondary | ICD-10-CM

## 2019-12-21 DIAGNOSIS — I2510 Atherosclerotic heart disease of native coronary artery without angina pectoris: Secondary | ICD-10-CM

## 2020-01-12 ENCOUNTER — Telehealth: Payer: Self-pay

## 2020-01-12 NOTE — Telephone Encounter (Signed)
Called patient off recall.  We are no longer in network for patient and they are requesting we not call them  Deleting recall.

## 2020-05-30 IMAGING — DX DG KNEE COMPLETE 4+V*R*
4 series · 4 of 4 positions shown · non-contrast
Comparison: None.

CLINICAL DATA: Bilateral knee pain

EXAM:
LEFT KNEE - COMPLETE 4+ VIEW; RIGHT KNEE - COMPLETE 4+ VIEW

[knee ap]
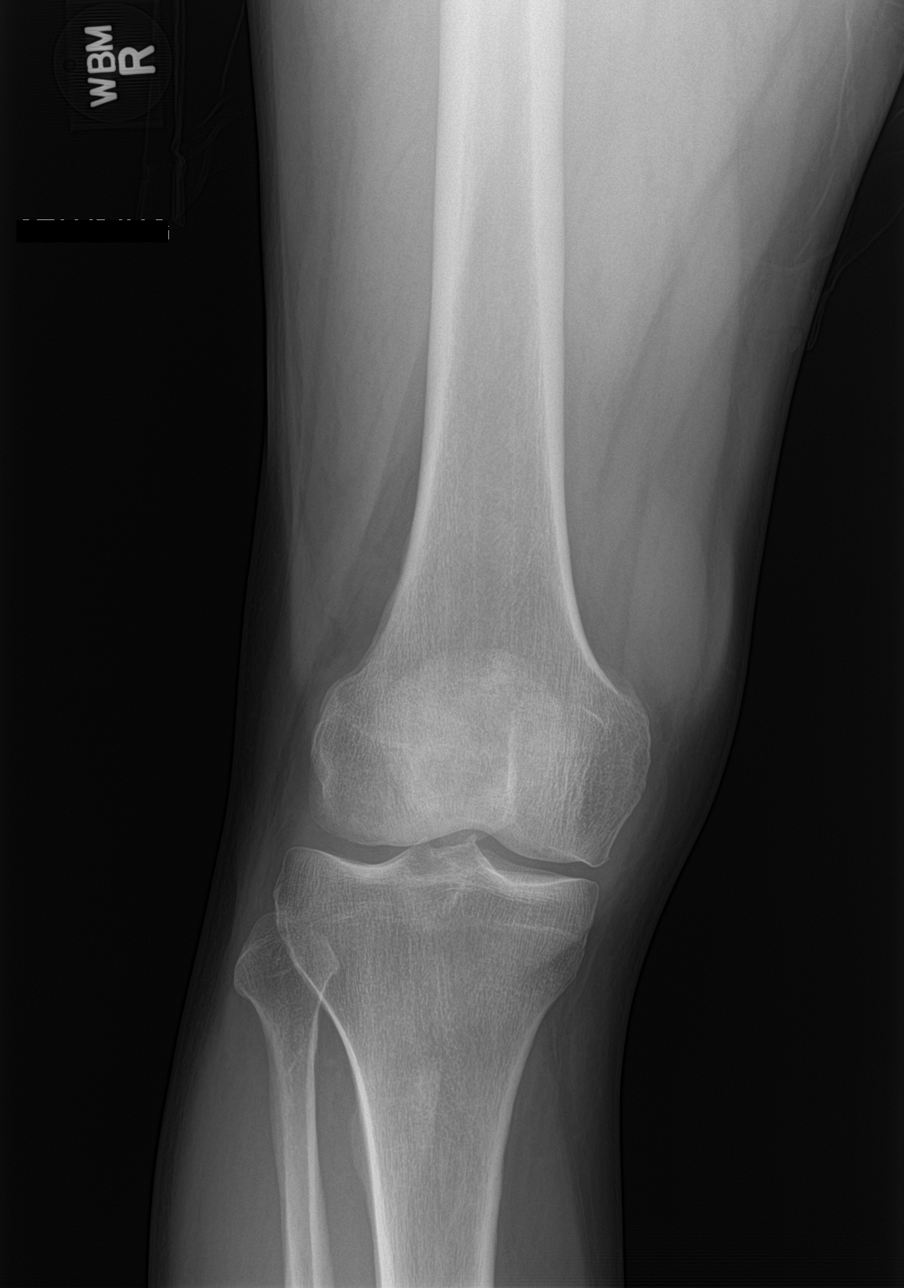

[knee tunnel]
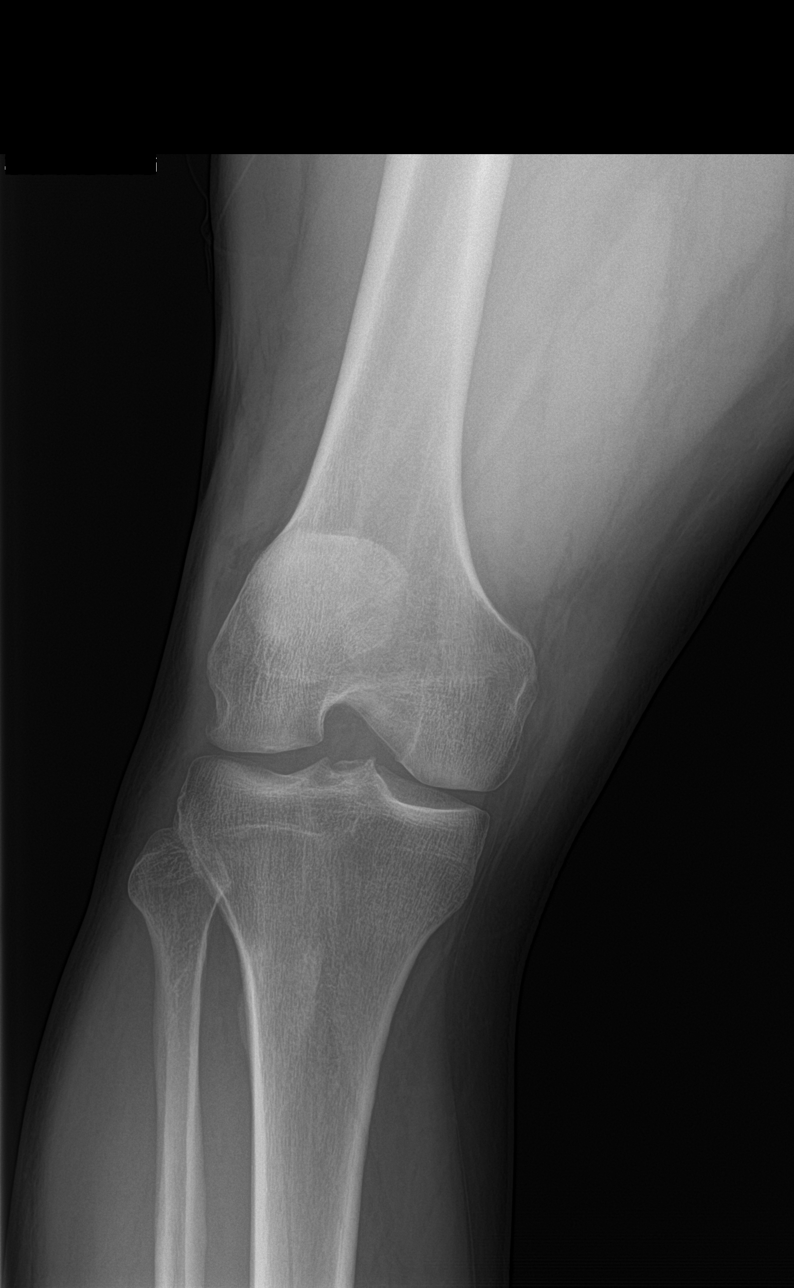

[knee lat]
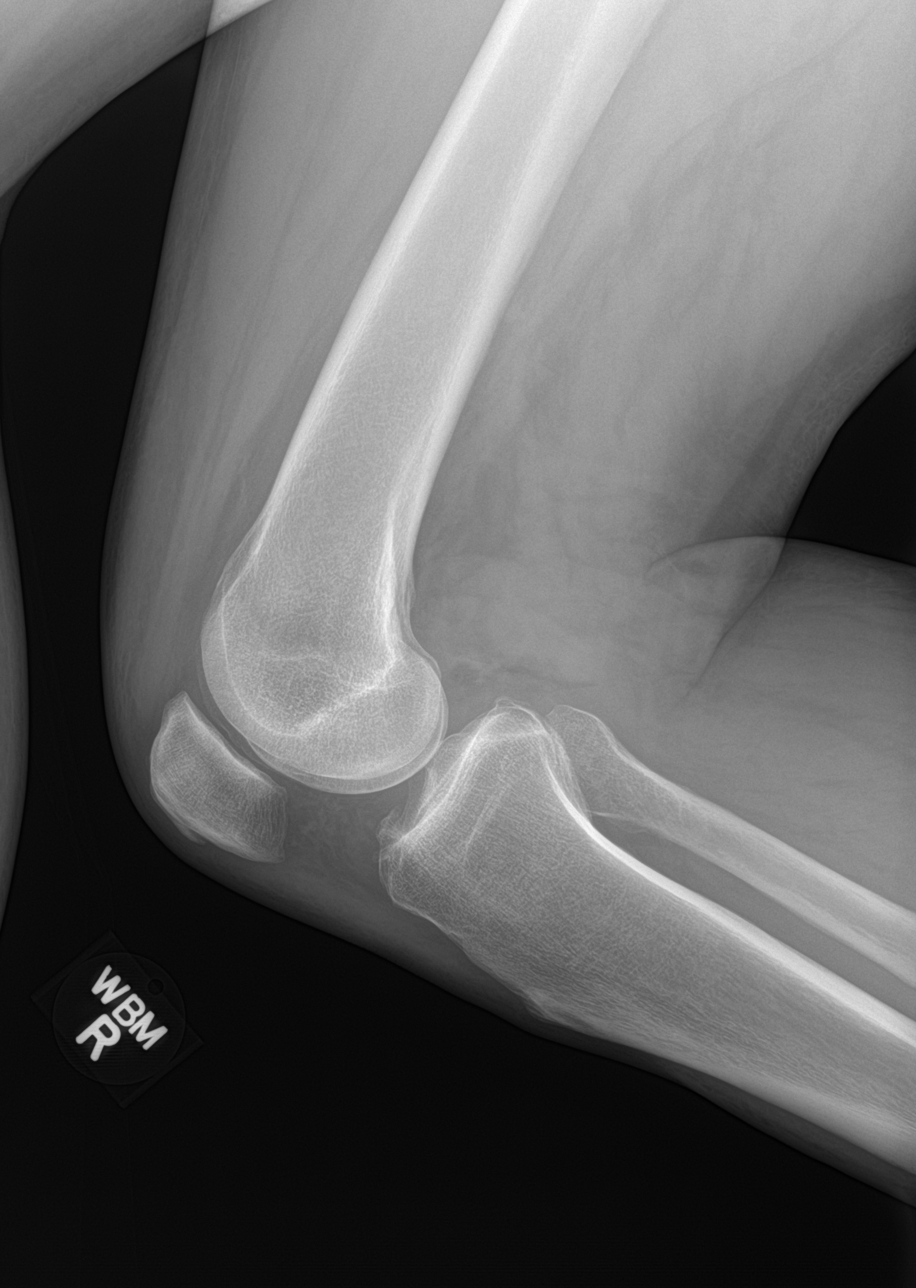

[patella skyline]
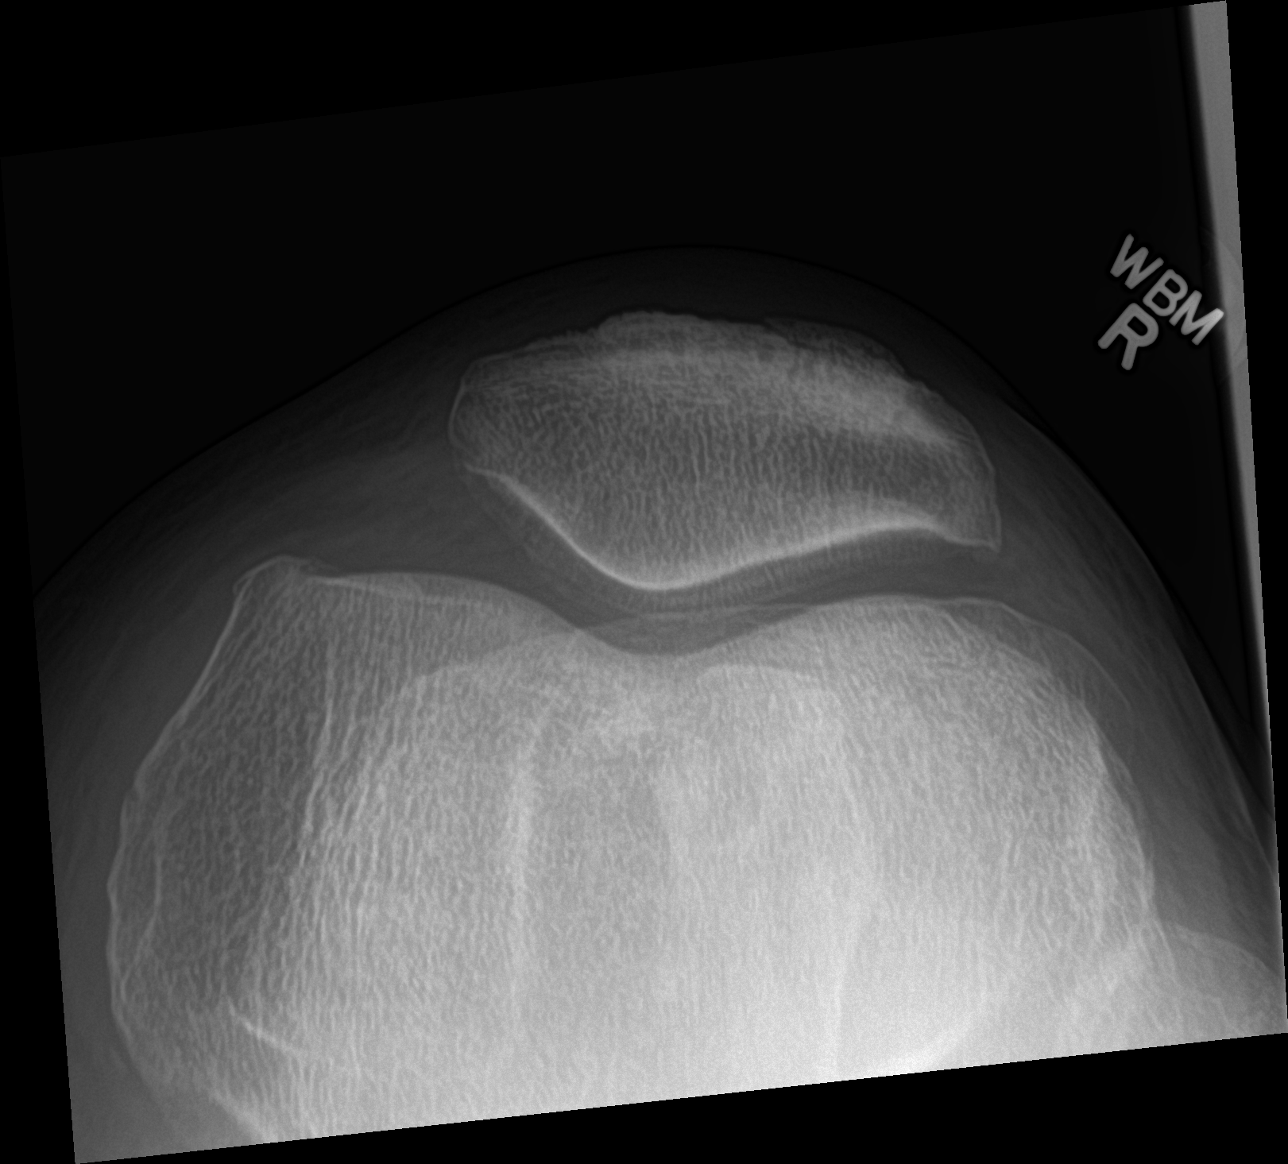

[4 of 4 positions shown; findings below may reference images not displayed]

FINDINGS: Mild bilateral knee joint degenerative changes with early joint
space narrowing and spurring in the medial compartments most notably
and left greater than right. No acute bony abnormality,
osteochondral lesion, erosions or chondrocalcinosis.

No definite joint effusions.
IMPRESSION: Mild bilateral knee joint degenerative changes most notable in the
medial compartments and slightly greater on the left than the right.

No acute bony findings or joint effusion.

## 2021-04-19 ENCOUNTER — Ambulatory Visit: Payer: Self-pay | Admitting: Family Medicine

## 2021-04-19 ENCOUNTER — Other Ambulatory Visit: Payer: Self-pay

## 2021-06-23 ENCOUNTER — Ambulatory Visit (INDEPENDENT_AMBULATORY_CARE_PROVIDER_SITE_OTHER): Payer: Self-pay | Admitting: Family Medicine

## 2021-06-23 ENCOUNTER — Other Ambulatory Visit: Payer: Self-pay

## 2021-06-23 VITALS — BP 170/87 | HR 60 | Ht 71.0 in | Wt 230.6 lb

## 2021-06-23 DIAGNOSIS — Z125 Encounter for screening for malignant neoplasm of prostate: Secondary | ICD-10-CM

## 2021-06-23 DIAGNOSIS — E1169 Type 2 diabetes mellitus with other specified complication: Secondary | ICD-10-CM

## 2021-06-23 DIAGNOSIS — E785 Hyperlipidemia, unspecified: Secondary | ICD-10-CM

## 2021-06-23 DIAGNOSIS — I10 Essential (primary) hypertension: Secondary | ICD-10-CM

## 2021-06-23 MED ORDER — GLIPIZIDE ER 5 MG PO TB24: 5.0000 mg | ORAL_TABLET | Freq: Every day | ORAL | 1 refills | Status: DC

## 2021-06-23 NOTE — Progress Notes (Signed)
Subjective:    Patient ID: [name] [name], male    DOB: 1960/12/21, 60 y.o.   MRN: [id number]  [name] [name] is a 60 y.o. male presenting on 06/23/2021 for Hypertension and Diabetes  Return to clinic today to re-establish care. Here with wife [name] [name]  HPI  CHRONIC DM, Type 2: Reports no concerns. Recent concerns with some elevated sugar, off glipizide and goal to resume diet changes CBGs: Reviewed Meds: Glipizide ER 5mg  daily, Metformin XR 750 BID Reports  good compliance. Tolerating well w/o side-effects Currently on ARB Lifestyle: - Diet (goal to improve low carb atkins diet)  - Exercise (goal to restart) Denies hypoglycemia, polyuria, visual changes, numbness or tingling.  CHRONIC HTN: Reports elevated BP. Home BP readings avg 170 in AM and afternoon BP mid 140s. Current Meds - Atenolol 25mg  daily, Losartan 50mg  daily Reports good compliance, took meds today. Tolerating well, w/o complaints. Denies CP, dyspnea, HA, edema, dizziness / lightheadedness  Secondary prevention Stroke On Plavix + ASA, DAPT   Fatigue, Leg Weakness On legs 8-10 hours, usually by 2-3pm in afternoon will hit "the wall" and have difficulty Admits stiffness difficulty getting started  Suspected Sleep Apnea Excessive Daytime Sleepiness Cannot wear mask CPAP Theres a device that can trigger he will look into this.   Health Maintenance: Future Flu Shot when ready at pharmacy.  He declines COVID vaccine booster.  Depression screen [facility] 2/9 06/23/2021 04/15/2018 01/02/2018  Decreased Interest 0 0 0  Down, Depressed, Hopeless 0 0 0  PHQ - 2 Score 0 0 0    Past Medical History:  Diagnosis Date   Anxiety    CAD (coronary artery disease)    s/p stenting   GERD (gastroesophageal reflux disease)    MI (myocardial infarction) (HCC) 2000   Past Surgical History:  Procedure Laterality Date   CARDIAC CATHETERIZATION     [facility]   CORONARY STENT PLACEMENT  2000   RCA, Stent s/p MI   Social History    Socioeconomic History   Marital status: Married    Spouse name: 04/17/2018   Number of children: 0   Years of education: College   Highest education level: Bachelor's degree (e.g., BA, AB, BS)  Occupational History   Occupation: [employer]ICE [employer]    Comment: Self employed, 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, locally  Tobacco Use   Smoking status: Former    Types: Cigarettes    Quit date: 05/19/1999    Years since quitting: 22.1   Smokeless tobacco: Former  2001 Use: Never used  Substance and Sexual Activity   Alcohol use: Yes    Comment: 8-10 oz, twice weekly   Drug use: No    Comment: last in 1980's   Sexual activity: Yes  Other Topics Concern   Not on file  Social History Narrative   Not on file   Social Determinants of Health   Financial Resource Strain: Not on file  Food Insecurity: Not on file  Transportation Needs: Not on file  Physical Activity: Not on file  Stress: Not on file  Social Connections: Not on file  Intimate Partner Violence: Not on file   Family History  Problem Relation Age of Onset   Hypertension Mother    Hyperlipidemia Mother    Arthritis Mother    Diabetes Mother    Hypertension Father    Hyperlipidemia Father    Alcohol abuse Father    Diabetes Father    Heart disease Father  arrhythmia   Colon polyps Father    Colitis Unknown    Heart disease Unknown    Diabetes Sister    Diabetes Brother    Current Outpatient Medications on File Prior to Visit  Medication Sig   aspirin EC 81 MG tablet Take 81 mg by mouth daily.   atenolol (TENORMIN) 25 MG tablet TAKE 1 TABLET BY MOUTH EVERY DAY   Cholecalciferol (VITAMIN D3) 5000 units CAPS Take 1 capsule (5,000 Units total) by mouth daily. For 12 weeks, then start Vitamin D3 2,000 units daily (OTC)   clopidogrel (PLAVIX) 75 MG tablet TAKE 1 TABLET BY MOUTH DAILY GENERIC EQUIVALENT FOR PLAVIX   losartan (COZAAR) 50 MG tablet TAKE 1 TABLET BY MOUTH EVERY DAY   meloxicam (MOBIC) 15 MG tablet TAKE 1  TABLET(15 MG) BY MOUTH DAILY FOR 1 TO 2 WEEKS AS NEEDED FOR PAIN OR ARTHRITIS. MAY REPEAT   metFORMIN (GLUCOPHAGE-XR) 750 MG 24 hr tablet TAKE 1 TABLET(750 MG) BY MOUTH DAILY WITH BREAKFAST   No current facility-administered medications on file prior to visit.    Review of Systems Per HPI unless specifically indicated above      Objective:    BP (!) 170/87   Pulse 60   Ht 5\' 11"  (1.803 m)   Wt 230 lb 9.6 oz (104.6 kg)   SpO2 98%   BMI 32.16 kg/m   Wt Readings from Last 3 Encounters:  06/23/21 230 lb 9.6 oz (104.6 kg)  08/05/18 225 lb (102.1 kg)  07/22/18 225 lb (102.1 kg)    Physical Exam Vitals and nursing note reviewed.  Constitutional:      General: He is not in acute distress.    Appearance: He is well-developed. He is not diaphoretic.     Comments: Well-appearing, comfortable, cooperative  HENT:     Head: Normocephalic and atraumatic.  Eyes:     General:        Right eye: No discharge.        Left eye: No discharge.     Conjunctiva/sclera: Conjunctivae normal.  Neck:     Thyroid: No thyromegaly.  Cardiovascular:     Rate and Rhythm: Normal rate and regular rhythm.     Pulses: Normal pulses.     Heart sounds: Normal heart sounds. No murmur heard. Pulmonary:     Effort: Pulmonary effort is normal. No respiratory distress.     Breath sounds: Normal breath sounds. No wheezing or rales.  Musculoskeletal:        General: Normal range of motion.     Cervical back: Normal range of motion and neck supple.  Lymphadenopathy:     Cervical: No cervical adenopathy.  Skin:    General: Skin is warm and dry.     Findings: No erythema or rash.  Neurological:     Mental Status: He is alert and oriented to person, place, and time. Mental status is at baseline.  Psychiatric:        Behavior: Behavior normal.     Comments: Well groomed, good eye contact, normal speech and thoughts    Diabetic Foot Exam - Simple   Simple Foot Form Diabetic Foot exam was performed with  the following findings: Yes 06/23/2021 10:09 AM  Visual Inspection No deformities, no ulcerations, no other skin breakdown bilaterally: Yes Sensation Testing Intact to touch and monofilament testing bilaterally: Yes Pulse Check Posterior Tibialis and Dorsalis pulse intact bilaterally: Yes Comments      Results for orders placed or performed  in visit on 07/22/18  POCT HgB A1C  Result Value Ref Range   Hemoglobin A1C 6.6 (A) 4.0 - 5.6 %      Assessment & Plan:   Problem List Items Addressed This Visit     Type 2 diabetes mellitus with other specified complication (HCC) - Primary    Due for A1c, off med and lifestyle not adequate Hyperglycemia still present Without Hypoglycemia. Complications - other including hyperlipidemia and hypertriglyceridemia, GERD, obesity, vascular disease with history of CAD, MI, CVA - increases risk of future cardiovascular complications   Plan:  1. Restart Metformin and Glipizide new orders. - previous failed Ozemipc due to high cost / coverage. Will look into GLP1 options again, given info on Trulicity Ozempic Mounjaro 2. Encourage improved lifestyle - low carb, low sugar diet, reduce portion size, start regular exercise 3. Check CBG, bring log to next visit for review 4. Continue ASA, ARB      Relevant Medications   glipiZIDE (GLUCOTROL XL) 5 MG 24 hr tablet   Other Relevant Orders   Hemoglobin A1c   Hyperlipidemia associated with type 2 diabetes mellitus (HCC)    ASCVD risk T2DM Hx of CVA CAD Discuss statin therapy at future visit once improved lifestyle, lab review and back on therapy      Relevant Medications   glipiZIDE (GLUCOTROL XL) 5 MG 24 hr tablet   Other Relevant Orders   Lipid panel   TSH   Essential hypertension    Elevated BP Need home BP log Improve lifestyle goals Complicated with history of CAD, s/p MI, CVA    Plan:  1. Continue Losartan 50mg  daily, Atenolol 25mg  2. Encourage improved lifestyle - low sodium diet,  regular exercise 3. Continue monitor BP outside office, bring readings to next visit, if persistently >135/85 or new symptoms notify office sooner  Anticipate may titrate dose medication at next visit if not improved      Relevant Orders   CBC with Differential/Platelet   Comprehensive metabolic panel   Other Visit Diagnoses     Screening for prostate cancer       Relevant Orders   PSA          Meds ordered this encounter  Medications   glipiZIDE (GLUCOTROL XL) 5 MG 24 hr tablet    Sig: Take 1 tablet (5 mg total) by mouth daily with breakfast.    Dispense:  90 tablet    Refill:  1      Follow up plan: Return in about 3 months (around 09/23/2021) for 3 month follow-up DM, HTN, Lab results / med changes.  [facility] labs printed given to patient for 3 months, he can check which lab he needs.  , DO [facility] 06/23/2021, 9:48 AM

## 2021-06-23 NOTE — Assessment & Plan Note (Signed)
ASCVD risk T2DM Hx of CVA CAD Discuss statin therapy at future visit once improved lifestyle, lab review and back on therapy

## 2021-06-23 NOTE — Assessment & Plan Note (Signed)
Due for A1c, off med and lifestyle not adequate Hyperglycemia still present Without Hypoglycemia. Complications - other including hyperlipidemia and hypertriglyceridemia, GERD, obesity, vascular disease with history of CAD, MI, CVA - increases risk of future cardiovascular complications   Plan:  1. Restart Metformin and Glipizide new orders. - previous failed Ozemipc due to high cost / coverage. Will look into GLP1 options again, given info on Trulicity Ozempic Mounjaro 2. Encourage improved lifestyle - low carb, low sugar diet, reduce portion size, start regular exercise 3. Check CBG, bring log to next visit for review 4. Continue ASA, ARB

## 2021-06-23 NOTE — Assessment & Plan Note (Signed)
Elevated BP Need home BP log Improve lifestyle goals Complicated with history of CAD, s/p MI, CVA    Plan:  1. Continue Losartan 50mg  daily, Atenolol 25mg  2. Encourage improved lifestyle - low sodium diet, regular exercise 3. Continue monitor BP outside office, bring readings to next visit, if persistently >135/85 or new symptoms notify office sooner  Anticipate may titrate dose medication at next visit if not improved

## 2021-06-23 NOTE — Patient Instructions (Addendum)
Thank you for coming to the office today.  Check into GLP1 Diabetes medications  Ozempic weekly inj (back order) - OR the pill form = Rybelsus (daily pill) Trulicity weekly inj  Newest one on market - Mounjaro (top of the line option) weekly inj  Check into Lab - ordered [facility] orders for you if I need to change, let me know.   Please schedule a Follow-up Appointment to: Return in about 3 months (around 09/23/2021) for 3 month follow-up DM, HTN, Lab results / med changes.  If you have any other questions or concerns, please feel free to call the office or send a message through MyChart. You may also schedule an earlier appointment if necessary.  Additionally, you may be receiving a survey about your experience at our office within a few days to 1 week by e-mail or mail. We value your feedback.  Saralyn Pilar, DO [facility], [place]

## 2021-10-23 ENCOUNTER — Encounter: Payer: Self-pay | Admitting: Family Medicine

## 2021-10-23 DIAGNOSIS — E785 Hyperlipidemia, unspecified: Secondary | ICD-10-CM

## 2021-10-23 DIAGNOSIS — G8929 Other chronic pain: Secondary | ICD-10-CM

## 2021-10-23 DIAGNOSIS — I10 Essential (primary) hypertension: Secondary | ICD-10-CM

## 2021-10-23 DIAGNOSIS — E1169 Type 2 diabetes mellitus with other specified complication: Secondary | ICD-10-CM

## 2021-10-23 MED ORDER — MELOXICAM 15 MG PO TABS: 15.0000 mg | ORAL_TABLET | Freq: Every day | ORAL | 3 refills | Status: DC | PRN

## 2021-10-23 MED ORDER — GLIPIZIDE ER 5 MG PO TB24: 5.0000 mg | ORAL_TABLET | Freq: Every day | ORAL | 1 refills | Status: DC

## 2021-10-23 MED ORDER — LOSARTAN POTASSIUM 50 MG PO TABS: 50.0000 mg | ORAL_TABLET | Freq: Every day | ORAL | 1 refills | Status: DC

## 2021-10-23 MED ORDER — ATENOLOL 25 MG PO TABS: 25.0000 mg | ORAL_TABLET | Freq: Every day | ORAL | 1 refills | Status: DC

## 2021-10-23 MED ORDER — EZETIMIBE 10 MG PO TABS: 10.0000 mg | ORAL_TABLET | Freq: Every day | ORAL | 1 refills | Status: DC

## 2021-11-17 ENCOUNTER — Encounter: Payer: Self-pay | Admitting: Family Medicine

## 2021-12-06 ENCOUNTER — Encounter: Payer: Self-pay | Admitting: Family Medicine

## 2021-12-06 DIAGNOSIS — E1169 Type 2 diabetes mellitus with other specified complication: Secondary | ICD-10-CM

## 2021-12-06 MED ORDER — METFORMIN HCL ER 750 MG PO TB24: 750.0000 mg | ORAL_TABLET | Freq: Every day | ORAL | 0 refills | Status: DC

## 2021-12-25 ENCOUNTER — Encounter: Payer: Self-pay | Admitting: Family Medicine

## 2021-12-25 DIAGNOSIS — G8929 Other chronic pain: Secondary | ICD-10-CM

## 2021-12-25 DIAGNOSIS — E1169 Type 2 diabetes mellitus with other specified complication: Secondary | ICD-10-CM

## 2021-12-25 DIAGNOSIS — I10 Essential (primary) hypertension: Secondary | ICD-10-CM

## 2021-12-25 MED ORDER — METFORMIN HCL ER 750 MG PO TB24: 750.0000 mg | ORAL_TABLET | Freq: Every day | ORAL | 0 refills | Status: DC

## 2021-12-25 MED ORDER — MELOXICAM 15 MG PO TABS: 15.0000 mg | ORAL_TABLET | Freq: Every day | ORAL | 0 refills | Status: DC | PRN

## 2021-12-26 MED ORDER — METFORMIN HCL ER 750 MG PO TB24: 750.0000 mg | ORAL_TABLET | Freq: Every day | ORAL | 1 refills | Status: DC

## 2021-12-26 NOTE — Addendum Note (Signed)
Addended by: Olin Hauser on: 12/26/2021 06:06 PM ? ? Modules accepted: Orders ? ?

## 2022-01-04 ENCOUNTER — Ambulatory Visit: Payer: Self-pay | Admitting: *Deleted

## 2022-01-04 NOTE — Telephone Encounter (Signed)
?  Chief Complaint: Covid positive ?Symptoms: onset Tuesday, mild dry cough, scratchy throat,runny nose.  ?Frequency: Onset Tuesday ?Pertinent Negatives: Patient denies fever, SOB ?Disposition: [] ED /[] Urgent Care (no appt availability in office) / [] Appointment(In office/virtual)/ []  [place] [facility]/ [] Home Care/ [] Refused Recommended Disposition /[] [place] [facility]/ [x]  Follow-up with PCP ?Additional Notes: Home care advise given, pt verbalizes understanding. Pt questioning if Dr. Parks Ranger recommends one of the oral anti-virals. Only interested if PCP recommends. Please advise. ?Reason for Disposition ? [1] COVID-19 diagnosed by positive lab test (e.g., PCR, rapid self-test kit) AND [2] mild symptoms (e.g., cough, fever, others) AND [3] no complications or SOB ? ?Answer Assessment - Initial Assessment Questions ?1. COVID-19 DIAGNOSIS: "Who made your COVID-19 diagnosis?" "Was it confirmed by a positive lab test or self-test?" If not diagnosed by a doctor (or NP/PA), ask "Are there lots of cases (community spread) where you live?" Note: See public health department website, if unsure. ?    Home ?2. COVID-19 EXPOSURE: "Was there any known exposure to COVID before the symptoms began?" CDC Definition of close contact: within 6 feet (2 meters) for a total of 15 minutes or more over a 24-hour period.  ?    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 ONSET: "When did the COVID-19 symptoms start?"  ?    Tuesday ?4. WORST SYMPTOM: "What is your worst symptom?" (e.g., cough, fever, shortness of breath, muscle aches) ?    Itchy nose ?5. COUGH: "Do you have a cough?" If Yes, ask: "How bad is the cough?"   ?    Mild, dry ?6. FEVER: "Do you have a fever?" If Yes, ask: "What is your temperature, how was it measured, and when did it start?" ?  no ?7. RESPIRATORY STATUS: "Describe your breathing?" (e.g., shortness of breath, wheezing, unable to speak)  ?  no ?8. BETTER-SAME-WORSE: "Are you getting better, staying the same or getting  worse compared to yesterday?"  If getting worse, ask, "In what way?" ?    *No Answer* ?9. HIGH RISK DISEASE: "Do you have any chronic medical problems?" (e.g., asthma, heart or lung disease, weak immune system, obesity, etc.) ?    *No Answer* ?10. VACCINE: "Have you had the COVID-19 vaccine?" If Yes, ask: "Which one, how many shots, when did you get it?" ?      no ?11. BOOSTER: "Have you received your COVID-19 booster?" If Yes, ask: "Which one and when did you get it?" ?      no ? ?13. OTHER SYMPTOMS: "Do you have any other symptoms?"  (e.g., chills, fatigue, headache, loss of smell or taste, muscle pain, sore throat) ? Scratchy throat, sneeezing ? ?Protocols used: Coronavirus (IRJJO-84) Diagnosed or Suspected-A-AH ? ?

## 2022-01-05 ENCOUNTER — Encounter: Payer: Self-pay | Admitting: *Deleted

## 2022-01-05 NOTE — Telephone Encounter (Signed)
Please review

## 2022-01-05 NOTE — Telephone Encounter (Signed)
PCP out of office until Monday. Reviewed problems list and medications. If symptoms are mild and improving, would not use oral antivirals. If symptoms seem to be not improving or getting worse, would recommend oral antiviral. Let me know what he would like to do. He can also do a virtual visit with Junie Panning Mecum today if he would like. ?

## 2022-01-05 NOTE — Telephone Encounter (Signed)
Message left for pt to return call, MyChart message sent. ?  ?

## 2022-01-05 NOTE — Telephone Encounter (Signed)
Pt review for Dr. Kirtland Bouchard ? ?Thank you,  ? ?-Vernona Rieger  ?

## 2022-01-05 NOTE — Telephone Encounter (Signed)
LMTCB 01/05/2022.  PEC please advise pt below when he calls back.   ? ? ?Thanks,  ? ?-Vernona Rieger  ?

## 2022-03-07 ENCOUNTER — Other Ambulatory Visit: Payer: Self-pay | Admitting: Family Medicine

## 2022-03-07 DIAGNOSIS — G8929 Other chronic pain: Secondary | ICD-10-CM

## 2022-03-07 NOTE — Telephone Encounter (Signed)
Requested medication (s) are due for refill today: yes  Requested medication (s) are on the active medication list: yes  Last refill:  12/25/21 #30/0  Future visit scheduled: no  Notes to clinic:  Unable to refill per protocol due to failed labs, no updated results.    Requested Prescriptions  Pending Prescriptions Disp Refills   meloxicam (MOBIC) 15 MG tablet [Pharmacy Med Name: MELOXICAM 15 MG TABLET] 30 tablet 3    Sig: TAKE 1 TABLET BY MOUTH EVERY DAY AS NEEDED FOR PAIN     Analgesics:  COX2 Inhibitors Failed - 03/07/2022  1:42 AM      Failed - Manual Review: Labs are only required if the patient has taken medication for more than 8 weeks.      Failed - HGB in normal range and within 360 days    Hemoglobin  Date Value Ref Range Status  09/01/2017 16.6 13.0 - 18.0 g/dL Final         Failed - Cr in normal range and within 360 days    Creat  Date Value Ref Range Status  12/26/2017 0.84 0.70 - 1.33 mg/dL Final    Comment:    For patients >79 years of age, the reference limit for Creatinine is approximately 13% higher for people identified as African-American. .          Failed - HCT in normal range and within 360 days    HCT  Date Value Ref Range Status  09/01/2017 48.3 40.0 - 52.0 % Final         Failed - AST in normal range and within 360 days    AST  Date Value Ref Range Status  12/26/2017 21 10 - 35 U/L Final         Failed - ALT in normal range and within 360 days    ALT  Date Value Ref Range Status  12/26/2017 45 9 - 46 U/L Final         Failed - eGFR is 30 or above and within 360 days    GFR, Est African American  Date Value Ref Range Status  12/26/2017 113 > OR = 60 mL/min/1.15m2 Final   GFR, Est Non African American  Date Value Ref Range Status  12/26/2017 98 > OR = 60 mL/min/1.99m2 Final         Passed - Patient is not pregnant      Passed - Valid encounter within last 12 months    Recent Outpatient Visits           8 months ago Type 2  diabetes mellitus with other specified complication, without long-term current use of insulin ([place])   [facility], Devonne Doughty, DO   3 years ago Type 2 diabetes mellitus with other specified complication, without long-term current use of insulin ([place])   [facility], Devonne Doughty, DO   3 years ago Type 2 diabetes mellitus with other specified complication, without long-term current use of insulin [facility])   [place], DO   4 years ago Elevated hemoglobin A1c   [place], Devonne Doughty, DO   4 years ago Essential hypertension   [place], Devonne Doughty, DO

## 2022-05-02 ENCOUNTER — Other Ambulatory Visit: Payer: Self-pay | Admitting: Family Medicine

## 2022-05-02 DIAGNOSIS — I10 Essential (primary) hypertension: Secondary | ICD-10-CM

## 2022-05-02 DIAGNOSIS — E1169 Type 2 diabetes mellitus with other specified complication: Secondary | ICD-10-CM

## 2022-05-02 NOTE — Telephone Encounter (Signed)
Requested medication (s) are due for refill today: yes  Requested medication (s) are on the active medication list: yes  Last refill:  10/23/21  Future visit scheduled:yes  Notes to clinic:  Unable to refill per protocol due to failed labs, no updated results.  Called and scheduled pt an OV and lab visit. Routing for approval on medication without updated lab work.     Requested Prescriptions  Pending Prescriptions Disp Refills   glipiZIDE (GLUCOTROL XL) 5 MG 24 hr tablet [Pharmacy Med Name: GLIPIZIDE ER 5 MG TABLET] 30 tablet 5    Sig: TAKE 1 TABLET BY MOUTH EVERY DAY WITH BREAKFAST     Endocrinology:  Diabetes - Sulfonylureas Failed - 05/02/2022  1:56 AM      Failed - HBA1C is between 0 and 7.9 and within 180 days    Hemoglobin A1C  Date Value Ref Range Status  07/22/2018 6.6 (A) 4.0 - 5.6 % Final   Hgb A1c MFr Bld  Date Value Ref Range Status  04/11/2018 7.3 (H) <5.7 % of total Hgb Final    Comment:    For someone without known diabetes, a hemoglobin A1c value of 6.5% or greater indicates that they may have  diabetes and this should be confirmed with a follow-up  test. . For someone with known diabetes, a value <7% indicates  that their diabetes is well controlled and a value  greater than or equal to 7% indicates suboptimal  control. A1c targets should be individualized based on  duration of diabetes, age, comorbid conditions, and  other considerations. . Currently, no consensus exists regarding use of hemoglobin A1c for diagnosis of diabetes for children. .          Failed - Cr in normal range and within 360 days    Creat  Date Value Ref Range Status  12/26/2017 0.84 0.70 - 1.33 mg/dL Final    Comment:    For patients >8 years of age, the reference limit for Creatinine is approximately 13% higher for people identified as African-American. .          Failed - Valid encounter within last 6 months    Recent Outpatient Visits           10 months ago Type 2  diabetes mellitus with other specified complication, without long-term current use of insulin (HCC)   [facility], Netta Neat, DO   3 years ago Type 2 diabetes mellitus with other specified complication, without long-term current use of insulin (HCC)   [facility], Netta Neat, DO   4 years ago Type 2 diabetes mellitus with other specified complication, without long-term current use of insulin (HCC)   [facility], Netta Neat, DO   4 years ago Elevated hemoglobin A1c   [facility] Smitty Cords, DO   4 years ago Essential hypertension   [facility] Althea Charon, Netta Neat, DO       Future Appointments             In 2 weeks Althea Charon, Netta Neat, DO [facility], [facility]             atenolol (TENORMIN) 25 MG tablet [Pharmacy Med Name: ATENOLOL 25 MG TABLET] 30 tablet 5    Sig: TAKE 1 TABLET BY MOUTH EVERY DAY     Cardiovascular: Beta Blockers 2 Failed - 05/02/2022  1:56 AM      Failed -  Cr in normal range and within 360 days    Creat  Date Value Ref Range Status  12/26/2017 0.84 0.70 - 1.33 mg/dL Final    Comment:    For patients >68 years of age, the reference limit for Creatinine is approximately 13% higher for people identified as African-American. .          Failed - Last BP in normal range    BP Readings from Last 1 Encounters:  06/23/21 (!) 170/87         Failed - Valid encounter within last 6 months    Recent Outpatient Visits           10 months ago Type 2 diabetes mellitus with other specified complication, without long-term current use of insulin (HCC)   [facility], Netta Neat, DO   3 years ago Type 2 diabetes mellitus with other specified complication, without long-term current use of insulin (HCC)   [facility], Netta Neat, DO   4 years ago Type 2  diabetes mellitus with other specified complication, without long-term current use of insulin [facility])   [facility], Netta Neat, DO   4 years ago Elevated hemoglobin A1c   [facility], Netta Neat, DO   4 years ago Essential hypertension   [facility] Althea Charon, Netta Neat, DO       Future Appointments             In 2 weeks Althea Charon, Netta Neat, DO [facility], [facility]            Passed - Last Heart Rate in normal range    Pulse Readings from Last 1 Encounters:  06/23/21 60

## 2022-05-03 ENCOUNTER — Other Ambulatory Visit: Payer: Self-pay

## 2022-05-03 DIAGNOSIS — E785 Hyperlipidemia, unspecified: Secondary | ICD-10-CM | POA: Diagnosis not present

## 2022-05-03 DIAGNOSIS — Z125 Encounter for screening for malignant neoplasm of prostate: Secondary | ICD-10-CM | POA: Diagnosis not present

## 2022-05-03 DIAGNOSIS — E1169 Type 2 diabetes mellitus with other specified complication: Secondary | ICD-10-CM

## 2022-05-04 LAB — CBC WITH DIFFERENTIAL/PLATELET
Absolute Monocytes: 482 cells/uL (ref 200–950)
Basophils Absolute: 67 cells/uL (ref 0–200)
Basophils Relative: 1.1 %
Eosinophils Absolute: 159 cells/uL (ref 15–500)
Eosinophils Relative: 2.6 %
HCT: 52.7 % — ABNORMAL HIGH (ref 38.5–50.0)
Hemoglobin: 17.4 g/dL — ABNORMAL HIGH (ref 13.2–17.1)
Lymphs Abs: 1769 cells/uL (ref 850–3900)
MCH: 29.2 pg (ref 27.0–33.0)
MCHC: 33 g/dL (ref 32.0–36.0)
MCV: 88.4 fL (ref 80.0–100.0)
MPV: 11.1 fL (ref 7.5–12.5)
Monocytes Relative: 7.9 %
Neutro Abs: 3623 cells/uL (ref 1500–7800)
Neutrophils Relative %: 59.4 %
Platelets: 209 10*3/uL (ref 140–400)
RBC: 5.96 10*6/uL — ABNORMAL HIGH (ref 4.20–5.80)
RDW: 12.8 % (ref 11.0–15.0)
Total Lymphocyte: 29 %
WBC: 6.1 10*3/uL (ref 3.8–10.8)

## 2022-05-04 LAB — COMPREHENSIVE METABOLIC PANEL
AG Ratio: 2.5 (calc) (ref 1.0–2.5)
ALT: 17 U/L (ref 9–46)
AST: 14 U/L (ref 10–35)
Albumin: 4.8 g/dL (ref 3.6–5.1)
Alkaline phosphatase (APISO): 67 U/L (ref 35–144)
BUN/Creatinine Ratio: 32 (calc) — ABNORMAL HIGH (ref 6–22)
BUN: 26 mg/dL — ABNORMAL HIGH (ref 7–25)
CO2: 27 mmol/L (ref 20–32)
Calcium: 9.7 mg/dL (ref 8.6–10.3)
Chloride: 106 mmol/L (ref 98–110)
Creat: 0.81 mg/dL (ref 0.70–1.35)
Globulin: 1.9 g/dL (calc) (ref 1.9–3.7)
Glucose, Bld: 95 mg/dL (ref 65–99)
Potassium: 4.8 mmol/L (ref 3.5–5.3)
Sodium: 140 mmol/L (ref 135–146)
Total Bilirubin: 0.6 mg/dL (ref 0.2–1.2)
Total Protein: 6.7 g/dL (ref 6.1–8.1)

## 2022-05-04 LAB — LIPID PANEL
Cholesterol: 238 mg/dL — ABNORMAL HIGH (ref <200)
HDL: 30 mg/dL — ABNORMAL LOW (ref >=40)
LDL Cholesterol (Calc): 165 mg/dL (calc) — ABNORMAL HIGH
Non-HDL Cholesterol (Calc): 208 mg/dL (calc) — ABNORMAL HIGH (ref <130)
Total CHOL/HDL Ratio: 7.9 (calc) — ABNORMAL HIGH (ref <5.0)
Triglycerides: 260 mg/dL — ABNORMAL HIGH (ref <150)

## 2022-05-04 LAB — TSH: TSH: 1.54 mIU/L (ref 0.40–4.50)

## 2022-05-04 LAB — HEMOGLOBIN A1C
Hgb A1c MFr Bld: 4.8 % of total Hgb (ref <5.7)
Mean Plasma Glucose: 91 mg/dL
eAG (mmol/L): 5 mmol/L

## 2022-05-04 LAB — PSA: PSA: 1.94 ng/mL (ref <=4.00)

## 2022-05-07 ENCOUNTER — Ambulatory Visit: Payer: Self-pay | Admitting: Family Medicine

## 2022-05-15 ENCOUNTER — Other Ambulatory Visit: Payer: Self-pay | Admitting: Family Medicine

## 2022-05-15 DIAGNOSIS — I10 Essential (primary) hypertension: Secondary | ICD-10-CM

## 2022-05-15 NOTE — Telephone Encounter (Signed)
Requested Prescriptions  Pending Prescriptions Disp Refills  . losartan (COZAAR) 50 MG tablet [Pharmacy Med Name: LOSARTAN POTASSIUM 50 MG TAB] 30 tablet 5    Sig: TAKE 1 TABLET BY MOUTH EVERY DAY     Cardiovascular:  Angiotensin Receptor Blockers Failed - 05/15/2022  1:18 PM      Failed - Last BP in normal range    BP Readings from Last 1 Encounters:  06/23/21 (!) 170/87         Failed - Valid encounter within last 6 months    Recent Outpatient Visits          10 months ago Type 2 diabetes mellitus with other specified complication, without long-term current use of insulin (HCC)   [facility], Netta Neat, DO   3 years ago Type 2 diabetes mellitus with other specified complication, without long-term current use of insulin (HCC)   [facility], Netta Neat, DO   4 years ago Type 2 diabetes mellitus with other specified complication, without long-term current use of insulin (HCC)   [facility], Netta Neat, DO   4 years ago Elevated hemoglobin A1c   [facility], Netta Neat, DO   4 years ago Essential hypertension   [facility], Netta Neat, DO      Future Appointments            Tomorrow Althea Charon, Netta Neat, DO [facility], [facility]           Passed - Cr in normal range and within 180 days    Creat  Date Value Ref Range Status  05/03/2022 0.81 0.70 - 1.35 mg/dL Final         Passed - K in normal range and within 180 days    Potassium  Date Value Ref Range Status  05/03/2022 4.8 3.5 - 5.3 mmol/L Final         Passed - Patient is not pregnant

## 2022-05-16 ENCOUNTER — Other Ambulatory Visit: Payer: Self-pay | Admitting: Family Medicine

## 2022-05-16 ENCOUNTER — Encounter: Payer: Self-pay | Admitting: Family Medicine

## 2022-05-16 ENCOUNTER — Ambulatory Visit: Payer: 59 | Admitting: Family Medicine

## 2022-05-16 VITALS — BP 129/70 | HR 60 | Ht 71.0 in | Wt 201.4 lb

## 2022-05-16 DIAGNOSIS — E1169 Type 2 diabetes mellitus with other specified complication: Secondary | ICD-10-CM

## 2022-05-16 DIAGNOSIS — L309 Dermatitis, unspecified: Secondary | ICD-10-CM

## 2022-05-16 DIAGNOSIS — E785 Hyperlipidemia, unspecified: Secondary | ICD-10-CM | POA: Diagnosis not present

## 2022-05-16 DIAGNOSIS — I2510 Atherosclerotic heart disease of native coronary artery without angina pectoris: Secondary | ICD-10-CM | POA: Diagnosis not present

## 2022-05-16 DIAGNOSIS — I10 Essential (primary) hypertension: Secondary | ICD-10-CM | POA: Diagnosis not present

## 2022-05-16 DIAGNOSIS — I252 Old myocardial infarction: Secondary | ICD-10-CM

## 2022-05-16 DIAGNOSIS — Z1211 Encounter for screening for malignant neoplasm of colon: Secondary | ICD-10-CM

## 2022-05-16 MED ORDER — CLOTRIMAZOLE-BETAMETHASONE 1-0.05 % EX CREA: TOPICAL_CREAM | CUTANEOUS | 1 refills | Status: AC

## 2022-05-16 MED ORDER — PRAVASTATIN SODIUM 10 MG PO TABS: 10.0000 mg | ORAL_TABLET | ORAL | 3 refills | Status: AC

## 2022-05-16 MED ORDER — METOPROLOL SUCCINATE ER 25 MG PO TB24: 25.0000 mg | ORAL_TABLET | Freq: Every day | ORAL | 3 refills | Status: DC

## 2022-05-16 NOTE — Patient Instructions (Addendum)
Thank you for coming to the office today.  Stop Atenolol >> Start Metoprolol XL 25mg  daily  Keep Losartan 50mg  daily, in the future we can lower to 25mg .  Recent Labs    05/03/22 0932  HGBA1C 4.8   Stop Glipizide  Keep on Metformin XR 750mg  once daily.  Start the new Pravastatin cholesterol med start with once a week for a month, then go up to twice a week until we meet again.  If you get muscle aches and symptoms, discontinue.  Try to follow through with the Cologuard  Follow instructions to collect sample, you may call the company for any help or questions, 24/7 telephone support at [phone].    Please schedule a Follow-up Appointment to: Return in about 6 months (around 11/15/2022) for 6 month DM A1c HTN.  If you have any other questions or concerns, please feel free to call the office or send a message through MyChart. You may also schedule an earlier appointment if necessary.  Additionally, you may be receiving a survey about your experience at our office within a few days to 1 week by e-mail or mail. We value your feedback.  05/05/22, DO [facility], [facility]    Heart-Healthy Eating Plan Many factors influence your heart health, including eating and exercise habits. Heart health is also called coronary health. Coronary risk increases with abnormal blood fat (lipid) levels. A heart-healthy eating plan includes limiting unhealthy fats, increasing healthy fats, limiting salt (sodium) intake, and making other diet and lifestyle changes. What is my plan? Your health care provider may recommend that: You limit your fat intake to _________% or less of your total calories each day. You limit your saturated fat intake to _________% or less of your total calories each day. You limit the amount of cholesterol in your diet to less than _________ mg per day. You limit the amount of sodium in your diet to less than _________ mg per day. What are tips  for following this plan? Cooking Cook foods using methods other than frying. Baking, boiling, grilling, and broiling are all good options. Other ways to reduce fat include: Removing the skin from poultry. Removing all visible fats from meats. Steaming vegetables in water or broth. Meal planning  At meals, imagine dividing your plate into fourths: Fill one-half of your plate with vegetables and green salads. Fill one-fourth of your plate with whole grains. Fill one-fourth of your plate with lean protein foods. Eat 2-4 cups of vegetables per day. One cup of vegetables equals 1 cup (91 g) broccoli or cauliflower florets, 2 medium carrots, 1 large bell pepper, 1 large sweet potato, 1 large tomato, 1 medium white potato, 2 cups (150 g) raw leafy greens. Eat 1-2 cups of fruit per day. One cup of fruit equals 1 small apple, 1 large banana, 1 cup (237 g) mixed fruit, 1 large orange,  cup (82 g) dried fruit, 1 cup (240 mL) 100% fruit juice. Eat more foods that contain soluble fiber. Examples include apples, broccoli, carrots, beans, peas, and barley. Aim to get 25-30 g of fiber per day. Increase your consumption of legumes, nuts, and seeds to 4-5 servings per week. One serving of dried beans or legumes equals  cup (90 g) cooked, 1 serving of nuts is  oz (12 almonds, 24 pistachios, or 7 walnut halves), and 1 serving of seeds equals  oz (8 g). Fats Choose healthy fats more often. Choose monounsaturated and polyunsaturated fats, such as olive and canola  oils, avocado oil, flaxseeds, walnuts, almonds, and seeds. Eat more omega-3 fats. Choose salmon, mackerel, sardines, tuna, flaxseed oil, and ground flaxseeds. Aim to eat fish at least 2 times each week. Check food labels carefully to identify foods with trans fats or high amounts of saturated fat. Limit saturated fats. These are found in animal products, such as meats, butter, and cream. Plant sources of saturated fats include palm oil, palm kernel  oil, and coconut oil. Avoid foods with partially hydrogenated oils in them. These contain trans fats. Examples are stick margarine, some tub margarines, cookies, crackers, and other baked goods. Avoid fried foods. General information Eat more home-cooked food and less restaurant, buffet, and fast food. Limit or avoid alcohol. Limit foods that are high in added sugar and simple starches such as foods made using white refined flour (white breads, pastries, sweets). Lose weight if you are overweight. Losing just 5-10% of your body weight can help your overall health and prevent diseases such as diabetes and heart disease. Monitor your sodium intake, especially if you have high blood pressure. Talk with your health care provider about your sodium intake. Try to incorporate more vegetarian meals weekly. What foods should I eat? Fruits All fresh, canned (in natural juice), or frozen fruits. Vegetables Fresh or frozen vegetables (raw, steamed, roasted, or grilled). Green salads. Grains Most grains. Choose whole wheat and whole grains most of the time. Rice and pasta, including brown rice and pastas made with whole wheat. Meats and other proteins Lean, well-trimmed beef, veal, pork, and lamb. Chicken and [place] without skin. All fish and shellfish. Wild duck, rabbit, pheasant, and venison. Egg whites or low-cholesterol egg substitutes. Dried beans, peas, lentils, and tofu. Seeds and most nuts. Dairy Low-fat or nonfat cheeses, including ricotta and mozzarella. Skim or 1% milk (liquid, powdered, or evaporated). Buttermilk made with low-fat milk. Nonfat or low-fat yogurt. Fats and oils Non-hydrogenated (trans-free) margarines. Vegetable oils, including soybean, sesame, sunflower, olive, avocado, peanut, safflower, corn, canola, and cottonseed. Salad dressings or mayonnaise made with a vegetable oil. Beverages Water (mineral or sparkling). Coffee and tea. Unsweetened ice tea. Diet beverages. Sweets and  desserts Sherbet, gelatin, and fruit ice. Small amounts of dark chocolate. Limit all sweets and desserts. Seasonings and condiments All seasonings and condiments. The items listed above may not be a complete list of foods and beverages you can eat. Contact a dietitian for more options. What foods should I avoid? Fruits Canned fruit in heavy syrup. Fruit in cream or butter sauce. Fried fruit. Limit coconut. Vegetables Vegetables cooked in cheese, cream, or butter sauce. Fried vegetables. Grains Breads made with saturated or trans fats, oils, or whole milk. Croissants. Sweet rolls. Donuts. High-fat crackers, such as cheese crackers and chips. Meats and other proteins Fatty meats, such as hot dogs, ribs, sausage, bacon, rib-eye roast or steak. High-fat deli meats, such as salami and bologna. Caviar. Domestic duck and goose. Organ meats, such as liver. Dairy Cream, sour cream, cream cheese, and creamed cottage cheese. Whole-milk cheeses. Whole or 2% milk (liquid, evaporated, or condensed). Whole buttermilk. Cream sauce or high-fat cheese sauce. Whole-milk yogurt. Fats and oils Meat fat, or shortening. Cocoa butter, hydrogenated oils, palm oil, coconut oil, palm kernel oil. Solid fats and shortenings, including bacon fat, salt pork, lard, and butter. Nondairy cream substitutes. Salad dressings with cheese or sour cream. Beverages Regular sodas and any drinks with added sugar. Sweets and desserts Frosting. Pudding. Cookies. Cakes. Pies. Milk chocolate or white chocolate. Buttered syrups. Full-fat ice cream  or ice cream drinks. The items listed above may not be a complete list of foods and beverages to avoid. Contact a dietitian for more information. Summary Heart-healthy meal planning includes limiting unhealthy fats, increasing healthy fats, limiting salt (sodium) intake and making other diet and lifestyle changes. Lose weight if you are overweight. Losing just 5-10% of your body weight can help  your overall health and prevent diseases such as diabetes and heart disease. Focus on eating a balance of foods, including fruits and vegetables, low-fat or nonfat dairy, lean protein, nuts and legumes, whole grains, and heart-healthy oils and fats. This information is not intended to replace advice given to you by your health care provider. Make sure you discuss any questions you have with your health care provider. Document Revised: 10/09/2021 Document Reviewed: 10/09/2021 Elsevier Patient Education  2023 [employer].

## 2022-05-16 NOTE — Progress Notes (Unsigned)
Subjective:    Patient ID: [name] [name], male    DOB: 1961-08-23, 61 y.o.   MRN: [id number]  [name] [name] is a 61 y.o. male presenting on 05/16/2022 for Diabetes and Hypertension   HPI  Alcohol Cessation Quit alcohol 2 months ago.  CHRONIC DM, Type 2: Improved overall with lifestyle diet CBGs 68-69 lowest, avg 100s Meds: Glipizide ER 5mg  daily, Metformin XR 750 BID Reports  good compliance. Tolerating well w/o side-effects Currently on ARB Lifestyle: - Diet (Reduced carbs and processed sugar) - Exercise (goal to restart) Denies hypoglycemia, polyuria, visual changes, numbness or tingling.  HYPERLIPIDEMIA: - Reports concerns. Last lipid panel 04/2022, significantly improved TG down to 260 previously 385-467.  Now on Keto diet, higher protein and cholesterol Off statin, due to myalgia Rosuvastatin Atorvastatin   CHRONIC HTN: Controlled HTN Current Meds - Atenolol 25mg  daily, Losartan 50mg  daily Reports good compliance, took meds today. Tolerating well, w/o complaints. Denies CP, dyspnea, HA, edema, dizziness / lightheadedness   Secondary prevention Stroke On ASA, off Plavix    Fatigue, Leg Weakness On legs 8-10 hours, usually by 2-3pm in afternoon will hit "the wall" and have difficulty Admits stiffness difficulty getting started   Possible OSA Excessive Daytime Sleepiness Cannot wear mask CPAP Improving, not sure if has significant OSA anymore  Health Maintenance: PSA 1.94 (04/2022) previous 1.4 (09/2020)     05/16/2022    2:48 PM 06/23/2021   10:07 AM 04/15/2018    1:30 PM  Depression screen PHQ 2/9  Decreased Interest 0 0 0  Down, Depressed, Hopeless 0 0 0  PHQ - 2 Score 0 0 0  Altered sleeping 0    Tired, decreased energy 0    Change in appetite 0    Feeling bad or failure about yourself  0    Trouble concentrating 0    Moving slowly or fidgety/restless 0    Suicidal thoughts 0    PHQ-9 Score 0    Difficult doing work/chores Not difficult at all       Social History   Tobacco Use   Smoking status: Former    Types: Cigarettes    Quit date: 05/19/1999    Years since quitting: 23.0   Smokeless tobacco: Former  04/17/2018 Use: Never used  Substance Use Topics   Alcohol use: Yes    Comment: 8-10 oz, twice weekly   Drug use: No    Comment: last in 1980's    Review of Systems  Constitutional:  Negative for activity change, appetite change, chills, diaphoresis, fatigue and fever.  HENT:  Negative for congestion and hearing loss.   Eyes:  Negative for visual disturbance.  Respiratory:  Negative for cough, chest tightness, shortness of breath and wheezing.   Cardiovascular:  Negative for chest pain, palpitations and leg swelling.  Gastrointestinal:  Negative for abdominal pain, constipation, diarrhea, nausea and vomiting.  Genitourinary:  Negative for dysuria, frequency and hematuria.  Musculoskeletal:  Negative for arthralgias and neck pain.  Skin:  Negative for rash.  Neurological:  Negative for dizziness, weakness, light-headedness, numbness and headaches.  Hematological:  Negative for adenopathy.  Psychiatric/Behavioral:  Negative for behavioral problems, dysphoric mood and sleep disturbance.    Per HPI unless specifically indicated above     Objective:    BP 129/70   Pulse 60   Ht 5\' 11"  (1.803 m)   Wt 201 lb 6.4 oz (91.4 kg)   SpO2 97%   BMI 28.09 kg/m  Wt Readings from Last 3 Encounters:  05/16/22 201 lb 6.4 oz (91.4 kg)  06/23/21 230 lb 9.6 oz (104.6 kg)  08/05/18 225 lb (102.1 kg)    Physical Exam Vitals and nursing note reviewed.  Constitutional:      General: He is not in acute distress.    Appearance: He is well-developed. He is not diaphoretic.     Comments: Well-appearing, comfortable, cooperative  HENT:     Head: Normocephalic and atraumatic.  Eyes:     General:        Right eye: No discharge.        Left eye: No discharge.     Conjunctiva/sclera: Conjunctivae normal.     Pupils:  Pupils are equal, round, and reactive to light.  Neck:     Thyroid: No thyromegaly.  Cardiovascular:     Rate and Rhythm: Normal rate and regular rhythm.     Pulses: Normal pulses.     Heart sounds: Normal heart sounds. No murmur heard. Pulmonary:     Effort: Pulmonary effort is normal. No respiratory distress.     Breath sounds: Normal breath sounds. No wheezing or rales.  Abdominal:     General: Bowel sounds are normal. There is no distension.     Palpations: Abdomen is soft. There is no mass.     Tenderness: There is no abdominal tenderness.  Musculoskeletal:        General: No tenderness. Normal range of motion.     Cervical back: Normal range of motion and neck supple.     Comments: Upper / Lower Extremities: - Normal muscle tone, strength bilateral upper extremities 5/5, lower extremities 5/5  Lymphadenopathy:     Cervical: No cervical adenopathy.  Skin:    General: Skin is warm and dry.     Findings: No erythema or rash.  Neurological:     Mental Status: He is alert and oriented to person, place, and time.     Comments: Distal sensation intact to light touch all extremities  Psychiatric:        Mood and Affect: Mood normal.        Behavior: Behavior normal.        Thought Content: Thought content normal.     Comments: Well groomed, good eye contact, normal speech and thoughts       Results for orders placed or performed in visit on 05/03/22  TSH  Result Value Ref Range   TSH 1.54 0.40 - 4.50 mIU/L  Comprehensive Metabolic Panel (CMET)  Result Value Ref Range   Glucose, Bld 95 65 - 99 mg/dL   BUN 26 (H) 7 - 25 mg/dL   Creat 2.59 5.63 - 8.75 mg/dL   BUN/Creatinine Ratio 32 (H) 6 - 22 (calc)   Sodium 140 135 - 146 mmol/L   Potassium 4.8 3.5 - 5.3 mmol/L   Chloride 106 98 - 110 mmol/L   CO2 27 20 - 32 mmol/L   Calcium 9.7 8.6 - 10.3 mg/dL   Total Protein 6.7 6.1 - 8.1 g/dL   Albumin 4.8 3.6 - 5.1 g/dL   Globulin 1.9 1.9 - 3.7 g/dL (calc)   AG Ratio 2.5 1.0 -  2.5 (calc)   Total Bilirubin 0.6 0.2 - 1.2 mg/dL   Alkaline phosphatase (APISO) 67 35 - 144 U/L   AST 14 10 - 35 U/L   ALT 17 9 - 46 U/L  PSA  Result Value Ref Range   PSA 1.94 < OR = 4.00  ng/mL  HgB A1c  Result Value Ref Range   Hgb A1c MFr Bld 4.8 <5.7 % of total Hgb   Mean Plasma Glucose 91 mg/dL   eAG (mmol/L) 5.0 mmol/L  Lipid panel  Result Value Ref Range   Cholesterol 238 (H) <200 mg/dL   HDL 30 (L) > OR = 40 mg/dL   Triglycerides 161 (H) <150 mg/dL   LDL Cholesterol (Calc) 165 (H) mg/dL (calc)   Total CHOL/HDL Ratio 7.9 (H) <5.0 (calc)   Non-HDL Cholesterol (Calc) 208 (H) <130 mg/dL (calc)  CBC with Differential  Result Value Ref Range   WBC 6.1 3.8 - 10.8 Thousand/uL   RBC 5.96 (H) 4.20 - 5.80 Million/uL   Hemoglobin 17.4 (H) 13.2 - 17.1 g/dL   HCT 09.6 (H) 04.5 - 40.9 %   MCV 88.4 80.0 - 100.0 fL   MCH 29.2 27.0 - 33.0 pg   MCHC 33.0 32.0 - 36.0 g/dL   RDW 81.1 91.4 - 78.2 %   Platelets 209 140 - 400 Thousand/uL   MPV 11.1 7.5 - 12.5 fL   Neutro Abs 3,623 1,500 - 7,800 cells/uL   Lymphs Abs 1,769 850 - 3,900 cells/uL   Absolute Monocytes 482 200 - 950 cells/uL   Eosinophils Absolute 159 15 - 500 cells/uL   Basophils Absolute 67 0 - 200 cells/uL   Neutrophils Relative % 59.4 %   Total Lymphocyte 29.0 %   Monocytes Relative 7.9 %   Eosinophils Relative 2.6 %   Basophils Relative 1.1 %      Assessment & Plan:   Problem List Items Addressed This Visit     CAD (coronary artery disease)    Stable without angina S/p MI and RCA Stent year 2000, initial plavix then off only on ASA but non adherent Previously followed by [facility]  Plan: Now on monotherapy ASA 81mg , off Plavix Consider Xarelto low dose CAD prevention   Continue med management ARB, BB, Statin      Relevant Medications   pravastatin (PRAVACHOL) 10 MG tablet   metoprolol succinate (TOPROL-XL) 25 MG 24 hr tablet   Essential hypertension    Improved BP control Improve lifestyle  goals Complicated with history of CAD, s/p MI, CVA    Plan:  1. DC Atenolol switch to Metoprolol XL 25mg  daily - Continue Losartan 50mg  daily - consider dose decrease in future 2. Encourage improved lifestyle - low sodium diet, regular exercise 3. Continue monitor BP outside office, bring readings to next visit, if persistently >135/85 or new symptoms notify office sooner      Relevant Medications   pravastatin (PRAVACHOL) 10 MG tablet   metoprolol succinate (TOPROL-XL) 25 MG 24 hr tablet   History of MI (myocardial infarction)   Hyperlipidemia associated with type 2 diabetes mellitus (HCC)    Hx of CVA CAD Failed drug myopathy Rosuvastatin Atorvastatin Discuss statin therapy again, restart therapy with PRavastatin intermittent dosing low dose       Relevant Medications   pravastatin (PRAVACHOL) 10 MG tablet   metoprolol succinate (TOPROL-XL) 25 MG 24 hr tablet   Type 2 diabetes mellitus with other specified complication (HCC) - Primary    Dramatic improved A1c now 4.8, exceptionally well controlled on Metformin monotherapy and lifestyle, wt loss  Complications - other including hyperlipidemia and hypertriglyceridemia, GERD, obesity, vascular disease with history of CAD, MI, CVA - increases risk of future cardiovascular complications   1. Continue Metformin XR 750mg  daily, future goal to come off medication, and remain  diet controlled 2. Encourage improved lifestyle - low carb, low sugar diet, reduce portion size, start regular exercise 3. Check CBG, bring log to next visit for review 4. Continue ASA, ARB      Relevant Medications   pravastatin (PRAVACHOL) 10 MG tablet   Other Visit Diagnoses     Dermatitis       Relevant Medications   clotrimazole-betamethasone (LOTRISONE) cream   Screening for colon cancer             Meds ordered this encounter  Medications   pravastatin (PRAVACHOL) 10 MG tablet    Sig: Take 1 tablet (10 mg total) by mouth 2 (two) times a week.     Dispense:  24 tablet    Refill:  3   metoprolol succinate (TOPROL-XL) 25 MG 24 hr tablet    Sig: Take 1 tablet (25 mg total) by mouth daily.    Dispense:  90 tablet    Refill:  3   clotrimazole-betamethasone (LOTRISONE) cream    Sig: Apply 1-2 times a day for worsening flare dry skin dermatitis, may re-use daily up to 1 week as needed.    Dispense:  30 g    Refill:  1     Follow up plan: Return in about 6 months (around 11/15/2022) for 6 month DM A1c HTN.   Saralyn Pilar, DO [facility] 05/16/2022, 3:01 PM

## 2022-05-16 NOTE — Telephone Encounter (Signed)
Requested Prescriptions  Pending Prescriptions Disp Refills  . glipiZIDE (GLUCOTROL XL) 5 MG 24 hr tablet [Pharmacy Med Name: GLIPIZIDE ER 5 MG TABLET] 90 tablet 1    Sig: TAKE 1 TABLET BY MOUTH EVERY DAY WITH BREAKFAST     Endocrinology:  Diabetes - Sulfonylureas Failed - 05/16/2022  9:23 AM      Failed - Valid encounter within last 6 months    Recent Outpatient Visits          Today Type 2 diabetes mellitus with other specified complication, without long-term current use of insulin [facility])   [facility], Netta Neat, DO   10 months ago Type 2 diabetes mellitus with other specified complication, without long-term current use of insulin (HCC)   [facility], Netta Neat, DO   3 years ago Type 2 diabetes mellitus with other specified complication, without long-term current use of insulin (HCC)   [facility], Netta Neat, DO   4 years ago Type 2 diabetes mellitus with other specified complication, without long-term current use of insulin (HCC)   [facility], Netta Neat, DO   4 years ago Elevated hemoglobin A1c   [facility], Netta Neat, DO      Future Appointments            In 6 months Althea Charon, Netta Neat, DO [facility]           Passed - HBA1C is between 0 and 7.9 and within 180 days    Hgb A1c MFr Bld  Date Value Ref Range Status  05/03/2022 4.8 <5.7 % of total Hgb Final    Comment:    For the purpose of screening for the presence of diabetes: . <5.7%       Consistent with the absence of diabetes 5.7-6.4%    Consistent with increased risk for diabetes             (prediabetes) > or =6.5%  Consistent with diabetes . This assay result is consistent with a decreased risk of diabetes. . Currently, no consensus exists regarding use of hemoglobin A1c for diagnosis of diabetes in children. . According to American  Diabetes Association (ADA) guidelines, hemoglobin A1c <7.0% represents optimal control in non-pregnant diabetic patients. Different metrics may apply to specific patient populations.  Standards of Medical Care in Diabetes(ADA). .          Passed - Cr in normal range and within 360 days    Creat  Date Value Ref Range Status  05/03/2022 0.81 0.70 - 1.35 mg/dL Final         . atenolol (TENORMIN) 25 MG tablet [Pharmacy Med Name: ATENOLOL 25 MG TABLET] 90 tablet 1    Sig: TAKE 1 TABLET BY MOUTH EVERY DAY     Cardiovascular: Beta Blockers 2 Failed - 05/16/2022  9:23 AM      Failed - Last BP in normal range    BP Readings from Last 1 Encounters:  05/16/22 129/70         Failed - Valid encounter within last 6 months    Recent Outpatient Visits          Today Type 2 diabetes mellitus with other specified complication, without long-term current use of insulin [facility])   [facility], Netta Neat, DO   10 months ago Type 2 diabetes mellitus with other specified complication, without long-term current use  of insulin [facility])   [facility] [place], Netta Neat, DO   3 years ago Type 2 diabetes mellitus with other specified complication, without long-term current use of insulin [facility])   [facility] [place], Netta Neat, DO   4 years ago Type 2 diabetes mellitus with other specified complication, without long-term current use of insulin [facility])   [facility] [place], Netta Neat, DO   4 years ago Elevated hemoglobin A1c   [facility], Netta Neat, DO      Future Appointments            In 6 months Althea Charon, Netta Neat, DO [facility]           Passed - Cr in normal range and within 360 days    Creat  Date Value Ref Range Status  05/03/2022 0.81 0.70 - 1.35 mg/dL Final         Passed - Last Heart Rate in normal range    Pulse Readings from Last 1  Encounters:  05/16/22 60

## 2022-05-17 NOTE — Assessment & Plan Note (Addendum)
Improved BP control Improve lifestyle goals Complicated with history of CAD, s/p MI, CVA    Plan:  1. DC Atenolol switch to Metoprolol XL 25mg  daily - Continue Losartan 50mg  daily - consider dose decrease in future 2. Encourage improved lifestyle - low sodium diet, regular exercise 3. Continue monitor BP outside office, bring readings to next visit, if persistently >135/85 or new symptoms notify office sooner

## 2022-05-17 NOTE — Assessment & Plan Note (Signed)
Dramatic improved A1c now 4.8, exceptionally well controlled on Metformin monotherapy and lifestyle, wt loss  Complications - other including hyperlipidemia and hypertriglyceridemia, GERD, obesity, vascular disease with history of CAD, MI, CVA - increases risk of future cardiovascular complications   1. Continue Metformin XR 750mg  daily, future goal to come off medication, and remain diet controlled 2. Encourage improved lifestyle - low carb, low sugar diet, reduce portion size, start regular exercise 3. Check CBG, bring log to next visit for review 4. Continue ASA, ARB

## 2022-05-17 NOTE — Assessment & Plan Note (Signed)
Stable without angina S/p MI and RCA Stent year 2000, initial plavix then off only on ASA but non adherent Previously followed by [facility]  Plan: Now on monotherapy ASA 81mg , off Plavix Consider Xarelto low dose CAD prevention   Continue med management ARB, BB, Statin

## 2022-05-17 NOTE — Assessment & Plan Note (Signed)
Hx of CVA CAD Failed drug myopathy Rosuvastatin Atorvastatin Discuss statin therapy again, restart therapy with PRavastatin intermittent dosing low dose

## 2022-07-16 ENCOUNTER — Other Ambulatory Visit: Payer: Self-pay | Admitting: Family Medicine

## 2022-07-16 ENCOUNTER — Encounter: Payer: Self-pay | Admitting: Family Medicine

## 2022-07-16 ENCOUNTER — Other Ambulatory Visit: Payer: Self-pay

## 2022-07-16 DIAGNOSIS — E1169 Type 2 diabetes mellitus with other specified complication: Secondary | ICD-10-CM

## 2022-07-16 MED ORDER — METFORMIN HCL ER 750 MG PO TB24: 750.0000 mg | ORAL_TABLET | Freq: Every day | ORAL | 1 refills | Status: DC

## 2022-07-17 NOTE — Telephone Encounter (Signed)
Requested Prescriptions  Pending Prescriptions Disp Refills  . metFORMIN (GLUCOPHAGE-XR) 750 MG 24 hr tablet [Pharmacy Med Name: METFORMIN HCL ER 750 MG TABLET] 30 tablet 5    Sig: TAKE 1 TABLET BY MOUTH EVERY DAY WITH BREAKFAST     Endocrinology:  Diabetes - Biguanides Failed - 07/16/2022  2:06 AM      Failed - eGFR in normal range and within 360 days    GFR, Est African American  Date Value Ref Range Status  12/26/2017 113 > OR = 60 mL/min/1.14m Final   GFR, Est Non African American  Date Value Ref Range Status  12/26/2017 98 > OR = 60 mL/min/1.777mFinal         Failed - B12 Level in normal range and within 720 days    No results found for: "VITAMINB12"       Passed - Cr in normal range and within 360 days    Creat  Date Value Ref Range Status  05/03/2022 0.81 0.70 - 1.35 mg/dL Final         Passed - HBA1C is between 0 and 7.9 and within 180 days    Hgb A1c MFr Bld  Date Value Ref Range Status  05/03/2022 4.8 <5.7 % of total Hgb Final    Comment:    For the purpose of screening for the presence of diabetes: . <5.7%       Consistent with the absence of diabetes 5.7-6.4%    Consistent with increased risk for diabetes             (prediabetes) > or =6.5%  Consistent with diabetes . This assay result is consistent with a decreased risk of diabetes. . Currently, no consensus exists regarding use of hemoglobin A1c for diagnosis of diabetes in children. . According to American Diabetes Association (ADA) guidelines, hemoglobin A1c <7.0% represents optimal control in non-pregnant diabetic patients. Different metrics may apply to specific patient populations.  Standards of Medical Care in Diabetes(ADA). . Renella Cunas Valid encounter within last 6 months    Recent Outpatient Visits          2 months ago Type 2 diabetes mellitus with other specified complication, without long-term current use of insulin ([facility]  [facility]   1 year ago Type 2 diabetes mellitus with other specified complication, without long-term current use of insulin ([place]  [facility]   3 years ago Type 2 diabetes mellitus with other specified complication, without long-term current use of insulin ([place]  [facility]   4 years ago Type 2 diabetes mellitus with other specified complication, without long-term current use of insulin (HCToft[place]  [facility]   4 years ago Elevated hemoglobin A1c   [place]      Future Appointments            In 4 months [place] [facility]Devonne DoughtyDO [facility] normal limits and completed in the last 12 months    WBC  Date Value Ref Range Status  05/03/2022 6.1 3.8 - 10.8 Thousand/uL Final   RBC  Date Value Ref Range Status  05/03/2022 5.96 (H) 4.20 - 5.80 Million/uL Final  Hemoglobin  Date Value Ref Range Status  05/03/2022 17.4 (H) 13.2 - 17.1 g/dL Final   HCT  Date Value Ref Range Status  05/03/2022 52.7 (H) 38.5 - 50.0 % Final   MCHC  Date Value Ref Range Status  05/03/2022 33.0 32.0 - 36.0 g/dL Final   [facility]  Date Value Ref Range Status  05/03/2022 29.2 27.0 - 33.0 pg Final   MCV  Date Value Ref Range Status  05/03/2022 88.4 80.0 - 100.0 fL Final   No results found for: "PLTCOUNTKUC", "LABPLAT", "POCPLA" RDW  Date Value Ref Range Status  05/03/2022 12.8 11.0 - 15.0 % Final

## 2022-08-15 ENCOUNTER — Other Ambulatory Visit: Payer: Self-pay | Admitting: Family Medicine

## 2022-08-15 DIAGNOSIS — I10 Essential (primary) hypertension: Secondary | ICD-10-CM

## 2022-08-15 NOTE — Telephone Encounter (Signed)
Requested Prescriptions  Pending Prescriptions Disp Refills   losartan (COZAAR) 50 MG tablet [Pharmacy Med Name: LOSARTAN POTASSIUM 50 MG TAB] 90 tablet 1    Sig: TAKE 1 TABLET BY MOUTH EVERY DAY     Cardiovascular:  Angiotensin Receptor Blockers Passed - 08/15/2022  2:38 AM      Passed - Cr in normal range and within 180 days    Creat  Date Value Ref Range Status  05/03/2022 0.81 0.70 - 1.35 mg/dL Final         Passed - K in normal range and within 180 days    Potassium  Date Value Ref Range Status  05/03/2022 4.8 3.5 - 5.3 mmol/L Final         Passed - Patient is not pregnant      Passed - Last BP in normal range    BP Readings from Last 1 Encounters:  05/16/22 129/70         Passed - Valid encounter within last 6 months    Recent Outpatient Visits           3 months ago Type 2 diabetes mellitus with other specified complication, without long-term current use of insulin (HCC)   [facility], Netta Neat, DO   1 year ago Type 2 diabetes mellitus with other specified complication, without long-term current use of insulin (HCC)   [facility], [facility] Neat, DO   4 years ago Type 2 diabetes mellitus with other specified complication, without long-term current use of insulin (HCC)   [facility], Netta Neat, DO   4 years ago Type 2 diabetes mellitus with other specified complication, without long-term current use of insulin [facility])   [facility] [place], Netta Neat, DO   4 years ago Elevated hemoglobin A1c   [facility] Althea Charon, Netta Neat, DO       Future Appointments             In 3 months Al[facility] Charon, Netta Neat, DO [facility], [facility]

## 2022-08-17 ENCOUNTER — Encounter: Payer: Self-pay | Admitting: Family Medicine

## 2022-11-19 ENCOUNTER — Ambulatory Visit: Payer: 59 | Admitting: Family Medicine

## 2022-12-10 ENCOUNTER — Other Ambulatory Visit: Payer: Self-pay | Admitting: Family Medicine

## 2022-12-10 DIAGNOSIS — E1169 Type 2 diabetes mellitus with other specified complication: Secondary | ICD-10-CM

## 2022-12-11 NOTE — Telephone Encounter (Signed)
Requested Prescriptions  Pending Prescriptions Disp Refills   metFORMIN (GLUCOPHAGE-XR) 750 MG 24 hr tablet [Pharmacy Med Name: metFORMIN HCL XR 750 MG TABLET] 30 tablet     Sig: TAKE 1 TABLET BY MOUTH DAILY WITH BREAKFAST     Endocrinology:  Diabetes - Biguanides Failed - 12/10/2022  6:23 AM      Failed - HBA1C is between 0 and 7.9 and within 180 days    Hgb A1c MFr Bld  Date Value Ref Range Status  05/03/2022 4.8 <5.7 % of total Hgb Final    Comment:    For the purpose of screening for the presence of diabetes: . <5.7%       Consistent with the absence of diabetes 5.7-6.4%    Consistent with increased risk for diabetes             (prediabetes) > or =6.5%  Consistent with diabetes . This assay result is consistent with a decreased risk of diabetes. . Currently, no consensus exists regarding use of hemoglobin A1c for diagnosis of diabetes in children. . According to American Diabetes Association (ADA) guidelines, hemoglobin A1c <7.0% represents optimal control in non-pregnant diabetic patients. Different metrics may apply to specific patient populations.  Standards of Medical Care in Diabetes(ADA). .          Failed - eGFR in normal range and within 360 days    GFR, Est African American  Date Value Ref Range Status  12/26/2017 113 > OR = 60 mL/min/1.84m2 Final   GFR, Est Non African American  Date Value Ref Range Status  12/26/2017 98 > OR = 60 mL/min/1.65m2 Final         Failed - B12 Level in normal range and within 720 days    No results found for: "VITAMINB12"       Failed - Valid encounter within last 6 months    Recent Outpatient Visits           6 months ago Type 2 diabetes mellitus with other specified complication, without long-term current use of insulin [facility])   [facility], Devonne Doughty, DO   1 year ago Type 2 diabetes mellitus with other specified complication, without long-term current use of insulin [facility])    [place] [facility] [place], Devonne Doughty, DO   4 years ago Type 2 diabetes mellitus with other specified complication, without long-term current use of insulin ([place])   [facility] [place], Devonne Doughty, DO   4 years ago Type 2 diabetes mellitus with other specified complication, without long-term current use of insulin ([place])   [place], DO   4 years ago Elevated hemoglobin A1c   [facility] [place], Devonne Doughty, DO              Passed - Cr in normal range and within 360 days    Creat  Date Value Ref Range Status  05/03/2022 0.81 0.70 - 1.35 mg/dL Final         Passed - CBC within normal limits and completed in the last 12 months    WBC  Date Value Ref Range Status  05/03/2022 6.1 3.8 - 10.8 Thousand/uL Final   RBC  Date Value Ref Range Status  05/03/2022 5.96 (H) 4.20 - 5.80 Million/uL Final   Hemoglobin  Date Value Ref Range Status  05/03/2022 17.4 (H) 13.2 - 17.1 g/dL Final  HCT  Date Value Ref Range Status  05/03/2022 52.7 (H) 38.5 - 50.0 % Final   MCHC  Date Value Ref Range Status  05/03/2022 33.0 32.0 - 36.0 g/dL Final   [facility]  Date Value Ref Range Status  05/03/2022 29.2 27.0 - 33.0 pg Final   MCV  Date Value Ref Range Status  05/03/2022 88.4 80.0 - 100.0 fL Final   No results found for: "PLTCOUNTKUC", "LABPLAT", "POCPLA" RDW  Date Value Ref Range Status  05/03/2022 12.8 11.0 - 15.0 % Final

## 2023-01-09 ENCOUNTER — Other Ambulatory Visit: Payer: Self-pay | Admitting: Family Medicine

## 2023-01-09 DIAGNOSIS — I10 Essential (primary) hypertension: Secondary | ICD-10-CM

## 2023-01-09 NOTE — Telephone Encounter (Signed)
Requested Prescriptions  Pending Prescriptions Disp Refills   losartan (COZAAR) 50 MG tablet [Pharmacy Med Name: LOSARTAN POTASSIUM 50 MG TAB] 90 tablet 0    Sig: TAKE 1 TABLET BY MOUTH DAILY     Cardiovascular:  Angiotensin Receptor Blockers Failed - 01/09/2023  6:23 AM      Failed - Cr in normal range and within 180 days    Creat  Date Value Ref Range Status  05/03/2022 0.81 0.70 - 1.35 mg/dL Final         Failed - K in normal range and within 180 days    Potassium  Date Value Ref Range Status  05/03/2022 4.8 3.5 - 5.3 mmol/L Final         Failed - Valid encounter within last 6 months    Recent Outpatient Visits           7 months ago Type 2 diabetes mellitus with other specified complication, without long-term current use of insulin [facility])   [place] [facility] Smitty Cords, DO   1 year ago Type 2 diabetes mellitus with other specified complication, without long-term current use of insulin [facility])   [place] [facility] [place], Netta Neat, DO   4 years ago Type 2 diabetes mellitus with other specified complication, without long-term current use of insulin [facility])   [place] [facility] [place], Netta Neat, DO   4 years ago Type 2 diabetes mellitus with other specified complication, without long-term current use of insulin [facility])   [place] [facility] [place], Netta Neat, DO   5 years ago Elevated hemoglobin A1c   [place] [facility] Smitty Cords, [place] - Patient is not pregnant      Passed - Last BP in normal range    BP Readings from Last 1 Encounters:  05/16/22 129/70

## 2023-01-25 ENCOUNTER — Other Ambulatory Visit: Payer: Self-pay | Admitting: Family Medicine

## 2023-01-25 DIAGNOSIS — E1169 Type 2 diabetes mellitus with other specified complication: Secondary | ICD-10-CM

## 2023-01-25 NOTE — Telephone Encounter (Signed)
Requested medications are due for refill today.  yes  Requested medications are on the active medications list.  yes  Last refill. 07/16/2022 #90 1 rf  Future visit scheduled.   no  Notes to clinic.  Labs are expired.    Requested Prescriptions  Pending Prescriptions Disp Refills   metFORMIN (GLUCOPHAGE-XR) 750 MG 24 hr tablet [Pharmacy Med Name: metFORMIN HCL XR 750 MG TABLET] 30 tablet     Sig: TAKE 1 TABLET BY MOUTH DAILY WITH BREAKFAST     Endocrinology:  Diabetes - Biguanides Failed - 01/25/2023 10:00 AM      Failed - HBA1C is between 0 and 7.9 and within 180 days    Hgb A1c MFr Bld  Date Value Ref Range Status  05/03/2022 4.8 <5.7 % of total Hgb Final    Comment:    For the purpose of screening for the presence of diabetes: . <5.7%       Consistent with the absence of diabetes 5.7-6.4%    Consistent with increased risk for diabetes             (prediabetes) > or =6.5%  Consistent with diabetes . This assay result is consistent with a decreased risk of diabetes. . Currently, no consensus exists regarding use of hemoglobin A1c for diagnosis of diabetes in children. . According to American Diabetes Association (ADA) guidelines, hemoglobin A1c <7.0% represents optimal control in non-pregnant diabetic patients. Different metrics may apply to specific patient populations.  Standards of Medical Care in Diabetes(ADA). .          Failed - eGFR in normal range and within 360 days    GFR, Est African American  Date Value Ref Range Status  12/26/2017 113 > OR = 60 mL/min/1.63m2 Final   GFR, Est Non African American  Date Value Ref Range Status  12/26/2017 98 > OR = 60 mL/min/1.57m2 Final         Failed - B12 Level in normal range and within 720 days    No results found for: "VITAMINB12"       Failed - Valid encounter within last 6 months    Recent Outpatient Visits           8 months ago Type 2 diabetes mellitus with other specified complication, without  long-term current use of insulin [facility])   [place] [facility] [place], Netta Neat, DO   1 year ago Type 2 diabetes mellitus with other specified complication, without long-term current use of insulin [facility])   [place] [facility] [place], Netta Neat, DO   4 years ago Type 2 diabetes mellitus with other specified complication, without long-term current use of insulin [facility])   [place] [facility] [place], Netta Neat, DO   4 years ago Type 2 diabetes mellitus with other specified complication, without long-term current use of insulin [facility])   [place] [facility] [place], Netta Neat, DO   5 years ago Elevated hemoglobin A1c   [facility], Netta Neat, DO              Passed - Cr in normal range and within 360 days    Creat  Date Value Ref Range Status  05/03/2022 0.81 0.70 - 1.35 mg/dL Final         Passed - CBC within normal limits and completed in the last 12 months    WBC  Date Value Ref Range Status  05/03/2022 6.1 3.8 - 10.8 Thousand/uL Final   RBC  Date Value Ref Range Status  05/03/2022 5.96 (H) 4.20 - 5.80 Million/uL Final   Hemoglobin  Date Value Ref Range Status  05/03/2022 17.4 (H) 13.2 - 17.1 g/dL Final   HCT  Date Value Ref Range Status  05/03/2022 52.7 (H) 38.5 - 50.0 % Final   MCHC  Date Value Ref Range Status  05/03/2022 33.0 32.0 - 36.0 g/dL Final   [facility]  Date Value Ref Range Status  05/03/2022 29.2 27.0 - 33.0 pg Final   MCV  Date Value Ref Range Status  05/03/2022 88.4 80.0 - 100.0 fL Final   No results found for: "PLTCOUNTKUC", "LABPLAT", "POCPLA" RDW  Date Value Ref Range Status  05/03/2022 12.8 11.0 - 15.0 % Final

## 2023-03-02 ENCOUNTER — Other Ambulatory Visit: Payer: Self-pay | Admitting: Family Medicine

## 2023-03-02 DIAGNOSIS — E1169 Type 2 diabetes mellitus with other specified complication: Secondary | ICD-10-CM

## 2023-03-04 NOTE — Telephone Encounter (Signed)
Requested Prescriptions  Refused Prescriptions Disp Refills   pravastatin (PRAVACHOL) 10 MG tablet [Pharmacy Med Name: PRAVASTATIN SODIUM 10 MG TAB] 32 tablet     Sig: TAKE ONE TABLET BY MOUTH TWICE WEEKLY     Cardiovascular:  Antilipid - Statins Failed - 03/02/2023  6:53 AM      Failed - Lipid Panel in normal range within the last 12 months    Cholesterol  Date Value Ref Range Status  05/03/2022 238 (H) <200 mg/dL Final   LDL Cholesterol (Calc)  Date Value Ref Range Status  05/03/2022 165 (H) mg/dL (calc) Final    Comment:    Reference range: <100 . Desirable range <100 mg/dL for primary prevention;   <70 mg/dL for patients with CHD or diabetic patients  with > or = 2 CHD risk factors. Marland Kitchen LDL-C is now calculated using the Martin-Hopkins  calculation, which is a validated novel method providing  better accuracy than the Friedewald equation in the  estimation of LDL-C.  Horald Pollen et al. Lenox Ahr. 4098;119(14): 2061-2068  (http://education.Quest[employer].com/faq/FAQ164)    HDL  Date Value Ref Range Status  05/03/2022 30 (L) > OR = 40 mg/dL Final   Triglycerides  Date Value Ref Range Status  05/03/2022 260 (H) <150 mg/dL Final    Comment:    . If a non-fasting specimen was collected, consider repeat triglyceride testing on a fasting specimen if clinically indicated.  Perry Mount et al. J. of Clin. Lipidol. 2015;9:129-169. Marland Kitchen          Passed - Patient is not pregnant      Passed - Valid encounter within last 12 months    Recent Outpatient Visits           9 months ago Type 2 diabetes mellitus with other specified complication, without long-term current use of insulin (HCC)   [place] [facility] [place], Netta Neat, DO   1 year ago Type 2 diabetes mellitus with other specified complication, without long-term current use of insulin [facility])   [place] [facility] [place], Netta Neat, DO   4 years ago Type 2 diabetes mellitus  with other specified complication, without long-term current use of insulin [facility])   [place] [facility] [place], Netta Neat, DO   4 years ago Type 2 diabetes mellitus with other specified complication, without long-term current use of insulin [facility])   [place] [facility] [place], Netta Neat, DO   5 years ago Elevated hemoglobin A1c    [facility] [place], Netta Neat, [place]

## 2023-04-09 ENCOUNTER — Other Ambulatory Visit: Payer: Self-pay | Admitting: Family Medicine

## 2023-04-09 DIAGNOSIS — I10 Essential (primary) hypertension: Secondary | ICD-10-CM

## 2023-04-10 NOTE — Telephone Encounter (Signed)
Patient needs OV for additional refills.  Requested Prescriptions  Pending Prescriptions Disp Refills   metoprolol succinate (TOPROL-XL) 25 MG 24 hr tablet [Pharmacy Med Name: METOPROLOL SUCC ER 25 MG TAB] 90 tablet 0    Sig: TAKE 1 TABLET BY MOUTH DAILY     Cardiovascular:  Beta Blockers Failed - 04/09/2023  6:22 AM      Failed - Valid encounter within last 6 months    Recent Outpatient Visits           10 months ago Type 2 diabetes mellitus with other specified complication, without long-term current use of insulin [facility])   [place] [facility] [place], Netta Neat, DO   1 year ago Type 2 diabetes mellitus with other specified complication, without long-term current use of insulin [facility])   [place] [place], Netta Neat, DO   4 years ago Type 2 diabetes mellitus with other specified complication, without long-term current use of insulin [facility])   [place] [facility] [place], Netta Neat, DO   4 years ago Type 2 diabetes mellitus with other specified complication, without long-term current use of insulin [facility])   [place] [facility] [place], Netta Neat, DO   5 years ago Elevated hemoglobin A1c    [facility] Saralyn Pilar J, DO              Passed - Last BP in normal range    BP Readings from Last 1 Encounters:  05/16/22 129/70         Passed - Last Heart Rate in normal range    Pulse Readings from Last 1 Encounters:  05/16/22 60

## 2023-04-14 ENCOUNTER — Other Ambulatory Visit: Payer: Self-pay | Admitting: Family Medicine

## 2023-04-14 DIAGNOSIS — I10 Essential (primary) hypertension: Secondary | ICD-10-CM

## 2023-04-16 NOTE — Telephone Encounter (Signed)
Requested medications are due for refill today.  yes  Requested medications are on the active medications list.  yes  Last refill. 01/09/2023 #90 0 rf  Future visit scheduled.   no  Notes to clinic.  Pt is more than 3 months overdue for OV.    Requested Prescriptions  Pending Prescriptions Disp Refills   losartan (COZAAR) 50 MG tablet [Pharmacy Med Name: LOSARTAN POTASSIUM 50 MG TAB] 90 tablet 0    Sig: TAKE 1 TABLET BY MOUTH DAILY     Cardiovascular:  Angiotensin Receptor Blockers Failed - 04/14/2023  6:53 AM      Failed - Cr in normal range and within 180 days    Creat  Date Value Ref Range Status  05/03/2022 0.81 0.70 - 1.35 mg/dL Final         Failed - K in normal range and within 180 days    Potassium  Date Value Ref Range Status  05/03/2022 4.8 3.5 - 5.3 mmol/L Final         Failed - Valid encounter within last 6 months    Recent Outpatient Visits           11 months ago Type 2 diabetes mellitus with other specified complication, without long-term current use of insulin [facility])   [place] [facility] Smitty Cords, DO   1 year ago Type 2 diabetes mellitus with other specified complication, without long-term current use of insulin [facility])   [place] [facility] [place], Netta Neat, DO   4 years ago Type 2 diabetes mellitus with other specified complication, without long-term current use of insulin [facility])   [place] [facility] [place], Netta Neat, DO   5 years ago Type 2 diabetes mellitus with other specified complication, without long-term current use of insulin [facility])   [place] [facility] [place], Netta Neat, DO   5 years ago Elevated hemoglobin A1c   [place] [facility] Smitty Cords, [place] - Patient is not pregnant      Passed - Last BP in normal range    BP Readings from Last 1 Encounters:  05/16/22 129/70

## 2023-04-27 ENCOUNTER — Other Ambulatory Visit: Payer: Self-pay | Admitting: Family Medicine

## 2023-04-27 DIAGNOSIS — E1169 Type 2 diabetes mellitus with other specified complication: Secondary | ICD-10-CM

## 2023-04-29 NOTE — Telephone Encounter (Signed)
No longer under providers care  Requested Prescriptions  Pending Prescriptions Disp Refills   metFORMIN (GLUCOPHAGE-XR) 750 MG 24 hr tablet [Pharmacy Med Name: metFORMIN HCL XR 750 MG TABLET] 90 tablet 0    Sig: TAKE 1 TABLET BY MOUTH DAILY WITH BREAKFAST     Endocrinology:  Diabetes - Biguanides Failed - 04/27/2023  6:53 AM      Failed - HBA1C is between 0 and 7.9 and within 180 days    Hgb A1c MFr Bld  Date Value Ref Range Status  05/03/2022 4.8 <5.7 % of total Hgb Final    Comment:    For the purpose of screening for the presence of diabetes: . <5.7%       Consistent with the absence of diabetes 5.7-6.4%    Consistent with increased risk for diabetes             (prediabetes) > or =6.5%  Consistent with diabetes . This assay result is consistent with a decreased risk of diabetes. . Currently, no consensus exists regarding use of hemoglobin A1c for diagnosis of diabetes in children. . According to American Diabetes Association (ADA) guidelines, hemoglobin A1c <7.0% represents optimal control in non-pregnant diabetic patients. Different metrics may apply to specific patient populations.  Standards of Medical Care in Diabetes(ADA). .          Failed - eGFR in normal range and within 360 days    GFR, Est African American  Date Value Ref Range Status  12/26/2017 113 > OR = 60 mL/min/1.5m2 Final   GFR, Est Non African American  Date Value Ref Range Status  12/26/2017 98 > OR = 60 mL/min/1.71m2 Final         Failed - B12 Level in normal range and within 720 days    No results found for: "VITAMINB12"       Failed - Valid encounter within last 6 months    Recent Outpatient Visits           11 months ago Type 2 diabetes mellitus with other specified complication, without long-term current use of insulin [facility])   [facility] [place], Netta Neat, DO   1 year ago Type 2 diabetes mellitus with other specified complication, without long-term  current use of insulin [facility])   [place] [facility] [place], Netta Neat, DO   4 years ago Type 2 diabetes mellitus with other specified complication, without long-term current use of insulin [facility])   [facility] [place], Netta Neat, DO   5 years ago Type 2 diabetes mellitus with other specified complication, without long-term current use of insulin (HCC)   [place] [facility] [place], Netta Neat, DO   5 years ago Elevated hemoglobin A1c   [place] [place], Netta Neat, DO              Passed - Cr in normal range and within 360 days    Creat  Date Value Ref Range Status  05/03/2022 0.81 0.70 - 1.35 mg/dL Final         Passed - CBC within normal limits and completed in the last 12 months    WBC  Date Value Ref Range Status  05/03/2022 6.1 3.8 - 10.8 Thousand/uL Final   RBC  Date Value Ref Range Status  05/03/2022 5.96 (H) 4.20 - 5.80 Million/uL Final   Hemoglobin  Date Value Ref Range Status  05/03/2022 17.4 (  H) 13.2 - 17.1 g/dL Final   HCT  Date Value Ref Range Status  05/03/2022 52.7 (H) 38.5 - 50.0 % Final   MCHC  Date Value Ref Range Status  05/03/2022 33.0 32.0 - 36.0 g/dL Final   [facility]  Date Value Ref Range Status  05/03/2022 29.2 27.0 - 33.0 pg Final   MCV  Date Value Ref Range Status  05/03/2022 88.4 80.0 - 100.0 fL Final   No results found for: "PLTCOUNTKUC", "LABPLAT", "POCPLA" RDW  Date Value Ref Range Status  05/03/2022 12.8 11.0 - 15.0 % Final

## 2023-07-05 ENCOUNTER — Other Ambulatory Visit: Payer: Self-pay

## 2023-07-05 ENCOUNTER — Other Ambulatory Visit: Payer: Self-pay | Admitting: Family Medicine

## 2023-07-05 DIAGNOSIS — I10 Essential (primary) hypertension: Secondary | ICD-10-CM

## 2023-07-05 NOTE — Telephone Encounter (Signed)
Requested medication (s) are due for refill today: yes  Requested medication (s) are on the active medication list: yes  Last refill:  04/10/23  Future visit scheduled: no  Notes to clinic:  Unable to refill per protocol, another provider listed as PCP, routing for review.      Requested Prescriptions  Pending Prescriptions Disp Refills   metoprolol succinate (TOPROL-XL) 25 MG 24 hr tablet [Pharmacy Med Name: METOPROLOL SUCC ER 25 MG TAB] 90 tablet 0    Sig: TAKE 1 TABLET BY MOUTH DAILY     Cardiovascular:  Beta Blockers Failed - 07/05/2023  6:22 AM      Failed - Valid encounter within last 6 months    Recent Outpatient Visits           1 year ago Type 2 diabetes mellitus with other specified complication, without long-term current use of insulin [facility])   [facility] [place], Netta Neat, DO   2 years ago Type 2 diabetes mellitus with other specified complication, without long-term current use of insulin [facility])   [facility] [place], Netta Neat, DO   4 years ago Type 2 diabetes mellitus with other specified complication, without long-term current use of insulin [facility])   [facility] [place], Netta Neat, DO   5 years ago Type 2 diabetes mellitus with other specified complication, without long-term current use of insulin [facility])   [place] [facility] [place], Netta Neat, DO   5 years ago Elevated hemoglobin A1c   [facility] Saralyn Pilar J, DO              Passed - Last BP in normal range    BP Readings from Last 1 Encounters:  05/16/22 129/70         Passed - Last Heart Rate in normal range    Pulse Readings from Last 1 Encounters:  05/16/22 60

## 2023-07-05 NOTE — Telephone Encounter (Signed)
Received a request from pharmacy to refill Metroprolol Succ ER. I opened encounter prior to noticing that patient is no longer seen at [place].
